# Patient Record
Sex: Male | Born: 1955 | Race: White | Hispanic: No | Smoking: Former smoker
Health system: Southern US, Community
[De-identification: ages and names within clinical notes are randomized; demographics above are authoritative.]

## PROBLEM LIST (undated history)

## (undated) DIAGNOSIS — I5189 Other ill-defined heart diseases: Secondary | ICD-10-CM

## (undated) DIAGNOSIS — C61 Malignant neoplasm of prostate: Secondary | ICD-10-CM

## (undated) DIAGNOSIS — M25569 Pain in unspecified knee: Secondary | ICD-10-CM

## (undated) DIAGNOSIS — M109 Gout, unspecified: Secondary | ICD-10-CM

## (undated) DIAGNOSIS — J45909 Unspecified asthma, uncomplicated: Secondary | ICD-10-CM

## (undated) DIAGNOSIS — M549 Dorsalgia, unspecified: Secondary | ICD-10-CM

## (undated) DIAGNOSIS — J449 Chronic obstructive pulmonary disease, unspecified: Secondary | ICD-10-CM

## (undated) DIAGNOSIS — I10 Essential (primary) hypertension: Secondary | ICD-10-CM

## (undated) HISTORY — DX: Malignant neoplasm of prostate: C61

## (undated) HISTORY — PX: PROSTATE SURGERY: SHX751

## (undated) HISTORY — DX: Dorsalgia, unspecified: M54.9

## (undated) HISTORY — DX: Pain in unspecified knee: M25.569

## (undated) HISTORY — DX: Essential (primary) hypertension: I10

---

## 2001-06-09 ENCOUNTER — Encounter: Payer: Self-pay | Admitting: Neurology

## 2001-06-09 ENCOUNTER — Ambulatory Visit (HOSPITAL_COMMUNITY): Admission: RE | Admit: 2001-06-09 | Discharge: 2001-06-09 | Payer: Self-pay | Admitting: Neurology

## 2008-08-22 ENCOUNTER — Ambulatory Visit: Payer: Self-pay | Admitting: Family Medicine

## 2010-01-22 ENCOUNTER — Ambulatory Visit: Payer: Self-pay | Admitting: Family Medicine

## 2010-02-02 ENCOUNTER — Ambulatory Visit: Payer: Self-pay | Admitting: Family Medicine

## 2010-03-16 ENCOUNTER — Ambulatory Visit: Payer: Self-pay | Admitting: Urology

## 2010-04-18 ENCOUNTER — Encounter (HOSPITAL_COMMUNITY): Payer: PRIVATE HEALTH INSURANCE

## 2010-04-18 ENCOUNTER — Other Ambulatory Visit: Payer: Self-pay | Admitting: Urology

## 2010-04-18 ENCOUNTER — Ambulatory Visit (HOSPITAL_COMMUNITY)
Admission: RE | Admit: 2010-04-18 | Discharge: 2010-04-18 | Disposition: A | Discharge status: Home / Self Care | Payer: PRIVATE HEALTH INSURANCE | Source: Ambulatory Visit | Attending: Urology | Admitting: Urology

## 2010-04-18 ENCOUNTER — Other Ambulatory Visit (HOSPITAL_COMMUNITY): Payer: Self-pay | Admitting: Urology

## 2010-04-18 DIAGNOSIS — C61 Malignant neoplasm of prostate: Secondary | ICD-10-CM | POA: Insufficient documentation

## 2010-04-18 DIAGNOSIS — I10 Essential (primary) hypertension: Secondary | ICD-10-CM | POA: Insufficient documentation

## 2010-04-18 DIAGNOSIS — Z01818 Encounter for other preprocedural examination: Secondary | ICD-10-CM | POA: Insufficient documentation

## 2010-04-18 DIAGNOSIS — M479 Spondylosis, unspecified: Secondary | ICD-10-CM | POA: Insufficient documentation

## 2010-04-18 LAB — BASIC METABOLIC PANEL
BUN: 11 mg/dL (ref 6–23)
CO2: 27 mEq/L (ref 19–32)
Calcium: 9.5 mg/dL (ref 8.4–10.5)
Chloride: 103 mEq/L (ref 96–112)
Creatinine, Ser: 0.99 mg/dL (ref 0.4–1.5)
GFR calc Af Amer: >60 mL/min (ref >60)
GFR calc non Af Amer: >60 mL/min (ref >60)
Glucose, Bld: 103 mg/dL — ABNORMAL HIGH (ref 70–99)
Potassium: 4.1 mEq/L (ref 3.5–5.1)
Sodium: 137 mEq/L (ref 135–145)

## 2010-04-18 LAB — CBC
HCT: 43.8 % (ref 39.0–52.0)
Hemoglobin: 15.2 g/dL (ref 13.0–17.0)
MCH: 32.9 pg (ref 26.0–34.0)
MCHC: 34.7 g/dL (ref 30.0–36.0)
MCV: 94.8 fL (ref 78.0–100.0)
Platelets: 218 10*3/uL (ref 150–400)
RBC: 4.62 MIL/uL (ref 4.22–5.81)
RDW: 12.1 % (ref 11.5–15.5)
WBC: 7.5 10*3/uL (ref 4.0–10.5)

## 2010-04-18 LAB — SURGICAL PCR SCREEN
MRSA, PCR: NEGATIVE
Staphylococcus aureus: NEGATIVE

## 2010-04-23 ENCOUNTER — Other Ambulatory Visit: Payer: Self-pay | Admitting: Urology

## 2010-04-23 ENCOUNTER — Inpatient Hospital Stay (HOSPITAL_COMMUNITY)
Admission: RE | Admit: 2010-04-23 | Discharge: 2010-04-24 | DRG: 708 | Disposition: A | Discharge status: Home / Self Care | Payer: PRIVATE HEALTH INSURANCE | Source: Ambulatory Visit | Attending: Urology | Admitting: Urology

## 2010-04-23 DIAGNOSIS — K219 Gastro-esophageal reflux disease without esophagitis: Secondary | ICD-10-CM | POA: Diagnosis present

## 2010-04-23 DIAGNOSIS — I10 Essential (primary) hypertension: Secondary | ICD-10-CM | POA: Diagnosis present

## 2010-04-23 DIAGNOSIS — C61 Malignant neoplasm of prostate: Principal | ICD-10-CM | POA: Diagnosis present

## 2010-04-23 DIAGNOSIS — G8929 Other chronic pain: Secondary | ICD-10-CM | POA: Diagnosis present

## 2010-04-23 DIAGNOSIS — M549 Dorsalgia, unspecified: Secondary | ICD-10-CM | POA: Diagnosis present

## 2010-04-23 LAB — HEMOGLOBIN AND HEMATOCRIT, BLOOD
HCT: 41.4 % (ref 39.0–52.0)
Hemoglobin: 14.5 g/dL (ref 13.0–17.0)

## 2010-04-23 LAB — TYPE AND SCREEN: ABO/RH(D): O POS

## 2010-04-23 LAB — ABO/RH: ABO/RH(D): O POS

## 2010-04-24 LAB — HEMOGLOBIN AND HEMATOCRIT, BLOOD: Hemoglobin: 12.9 g/dL — ABNORMAL LOW (ref 13.0–17.0)

## 2010-05-02 NOTE — Op Note (Signed)
NAME:  Jeffery Meyer, Jeffery Meyer                  ACCOUNT NO.:  [id number]  MEDICAL RECORD NO.:  [id number]           PATIENT TYPE:  I  LOCATION:  1423                         FACILITY:  [facility]  PHYSICIAN:  Heloise Purpura, MD      DATE OF BIRTH:  27-Feb-1955  DATE OF PROCEDURE:  04/23/2010 DATE OF DISCHARGE:                              OPERATIVE REPORT   PREOPERATIVE DIAGNOSIS:  Clinically localized adenocarcinoma of the prostate (clinical stage T1c N0 MX).  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  PROCEDURE:  Robotic-assisted laparoscopic radical prostatectomy (bilateral nerve sparing).  SURGEON:  Heloise Purpura, MD  ASSISTANT:  Delia Chimes NP-C  ANESTHESIA:  General.  COMPLICATIONS:  None.  ESTIMATED BLOOD LOSS:  300 cc.  INTRAVENOUS FLUIDS:  1500 cc of crystalloid.  SPECIMENS:  Prostate and seminal vesicles.  DISPOSITION:  Specimen to Pathology.  DRAINS: 1. A 20-French coude catheter. 2. A #19 Blake pelvic drain.  INDICATION:  Mr. Fridman is a 55 year old gentleman with clinically localized prostate cancer.  After discussion regarding management options for treatment, he elected to proceed with surgical therapy and the above procedures.  The potential risks, complications, and alternative treatment options were discussed in detail and informed consent was obtained.  DESCRIPTION OF PROCEDURE:  The patient was taken to the operating room and a general anesthetic was administered.  He was given preoperative antibiotics, placed in the dorsal lithotomy position, and prepped and draped in the usual sterile fashion.  Next, a preoperative time-out was performed. A site was then selected just to the left of the umbilicus for placement of the camera port.  This was placed using a standard open Hasson technique which allowed entry into the peritoneal cavity under direct vision without difficulty.  A 12-mm port was then  inserted and pneumoperitoneum established.  With the 0-degree lens, the abdomen was inspected and there was no evidence of any intra-abdominal injuries or other abnormalities.  A Foley catheter had been placed at beginning of the case as well.  The remaining ports were then placed with 8-mm robotic ports placed in the right lower quadrant, left lower quadrant, and far left lower quadrant.  A 5-mm port was placed in the right upper quadrant and a 12-mm port was placed in the far right lateral abdominal wall for laparoscopic assistance.  All ports were placed under direct vision without difficulty.  The surgical cart was then docked.  With the aid of cautery scissors, the bladder was reflected posteriorly allowing entry into space of Retzius and identification of the endopelvic fascia and prostate.  The endopelvic fascia was then incised from the apex back to the base of the prostate bilaterally and the underlying levator muscle fibers were swept laterally off the prostate thereby isolating the dorsal venous complex.  The dorsal vein was then stapled and divided with a 45 mm Flex Echelon stapler.  The bladder neck was identified with the aid of Foley catheter manipulation and was divided anteriorly.  This exposed the Foley catheter and the catheter balloon was deflated.  The catheter was then brought  into the operative field and used to retract the prostate anteriorly.  The posterior bladder neck was then examined and divided.  Dissection then proceeded between the bladder and prostate until the vasa deferentia and seminal vesicles were identified.  The vasa deferentia were isolated, divided, and lifted anteriorly.  The seminal vesicles were then dissected down to their tips with care to control the seminal vesicle arterial blood supply.  The seminal vesicles were then lifted anteriorly in the space between Denonvilliers fascia and the anterior rectum was bluntly developed thereby isolating  the vascular pedicles of the prostate.  The lateral prostatic fascia was then sharply incised allowing neurovascular bundles to be released.  The vascular pedicles of prostate were then ligated above the level of the neurovascular bundles with Hem-o-lok clips and divided with sharp cold- scissor dissection.  The neurovascular bundles were then swept off the apex of the prostate and urethra and the urethra was sharply transected allowing the prostate specimen to be disarticulated.  The pelvis was then copiously irrigated.  Hemostasis was evaluated and there was noted to be 2 separated arterial bleeding sites on the vascular pedicles bilaterally.  These were oversewn with figure-of-eight 3-0 Vicryl sutures, resulting in adequate hemostasis.  Attention then turned to the urethral anastomosis.  A 2-0 Vicryl slip-knot was placed between Denonvilliers fascia, the posterior bladder neck, and the posterior urethra to reapproximate these structures.  A double-armed 3-0 Monocryl suture was then used to perform a 360-degree running tension-free anastomosis between the bladder neck and urethra.  A new 20-French coude catheter was inserted into the bladder and irrigated.  There were no blood clots within the bladder and the anastomosis appeared to be watertight.  The pelvis was then examined 1 more time for hemostasis and there did appear to be some venous oozing from the distal aspects of the neurovascular bundles.  Therefore, 5 cc of FloSeal was placed over these areas which subsequently resulted in excellent hemostasis.  A #19 Blake drain was then brought through the left robotic port and positioned appropriately within the pelvis.  It was secured to skin with a nylon suture.  The pneumoperitoneum was let down and hemostasis again remained excellent.  The surgical cart was undocked.  The right lateral 12-mm port site was closed with a 0 Vicryl suture placed laparoscopically. All remaining ports  were removed under direct vision and the prostate specimen was removed intact within the Endopouch retrieval bag via the periumbilical port site.  This fascial opening was then closed with 2 running 0 Vicryl sutures.  All port sites were then injected with 0.25% Marcaine and reapproximated at the skin level with staples.  Sterile dressings were applied.  The patient appeared to tolerate procedure well without complications.  He was able to be extubated and transferred to the recovery unit in satisfactory condition.     Heloise Purpura, MD     LB/MEDQ  D:  04/23/2010  T:  04/23/2010  Job:  [id number]  Electronically Signed by Heloise Purpura MD on 05/02/2010 10:22:22 PM

## 2012-03-30 ENCOUNTER — Ambulatory Visit: Payer: Self-pay | Admitting: Family Medicine

## 2014-10-24 ENCOUNTER — Ambulatory Visit: Payer: Self-pay | Admitting: Family Medicine

## 2015-02-28 ENCOUNTER — Encounter: Payer: Self-pay | Admitting: Family Medicine

## 2015-03-24 ENCOUNTER — Encounter: Payer: Self-pay | Admitting: Family Medicine

## 2015-04-25 ENCOUNTER — Encounter: Payer: Self-pay | Admitting: Family Medicine

## 2015-05-26 ENCOUNTER — Encounter: Payer: Self-pay | Admitting: Family Medicine

## 2015-06-20 ENCOUNTER — Other Ambulatory Visit: Payer: Self-pay | Admitting: Family Medicine

## 2015-06-27 ENCOUNTER — Encounter: Payer: Self-pay | Admitting: Family Medicine

## 2015-06-27 ENCOUNTER — Ambulatory Visit (INDEPENDENT_AMBULATORY_CARE_PROVIDER_SITE_OTHER): Payer: BLUE CROSS/BLUE SHIELD | Admitting: Family Medicine

## 2015-06-27 VITALS — BP 132/80 | HR 86 | Temp 97.5°F | Resp 18 | Ht 74.0 in | Wt 248.2 lb

## 2015-06-27 DIAGNOSIS — I10 Essential (primary) hypertension: Secondary | ICD-10-CM

## 2015-06-27 DIAGNOSIS — M549 Dorsalgia, unspecified: Secondary | ICD-10-CM | POA: Insufficient documentation

## 2015-06-27 DIAGNOSIS — M25569 Pain in unspecified knee: Secondary | ICD-10-CM | POA: Insufficient documentation

## 2015-06-27 DIAGNOSIS — M25562 Pain in left knee: Secondary | ICD-10-CM

## 2015-06-27 DIAGNOSIS — M25561 Pain in right knee: Secondary | ICD-10-CM

## 2015-06-27 DIAGNOSIS — M545 Low back pain, unspecified: Secondary | ICD-10-CM

## 2015-06-27 MED ORDER — MELOXICAM 7.5 MG PO TABS: 7.5000 mg | ORAL_TABLET | Freq: Every day | ORAL | Status: DC

## 2015-06-27 MED ORDER — ATENOLOL 50 MG PO TABS
50.0000 mg | ORAL_TABLET | Freq: Every day | ORAL | Status: DC
Start: 2015-06-27 — End: 2015-10-23

## 2015-06-27 NOTE — Progress Notes (Signed)
Name: Jeffery Meyer   MRN: [id number]    DOB: [birth year]   Date:06/27/2015       Progress Note  Subjective  Chief Complaint  Chief Complaint  Patient presents with  . Hypertension    6 month follow up  . Joint Swelling    medication refills    Hypertension This is a chronic problem. The problem is controlled. Pertinent negatives include no blurred vision, chest pain, headaches or palpitations. Past treatments include beta blockers.   Knee and Low Back Pain: Pt. Is here for refill on Meloxicam 15  Mg daily, taken for bilateral knee and lower back pain. He has mild lumbar spondylosis, has back pain which intermittently radiates down into his right leg, no weakness or numbness in his legs. X rays of knee showed no abnormality but a small knee effusion.   Past Medical History  Diagnosis Date  . Hypertension   . Back pain   . Knee pain     Past Surgical History  Procedure Laterality Date  . Prostate surgery      Family History  Problem Relation Age of Onset  . Hypertension Mother   . Hypertension Father   . Hypertension Sister   . Hypertension Brother     Social History   Social History  . Marital Status: Married    Spouse Name: N/A  . Number of Children: N/A  . Years of Education: N/A   Occupational History  . Not on file.   Social History Main Topics  . Smoking status: Former Research scientist (life sciences)  . Smokeless tobacco: Never Used  . Alcohol Use: No  . Drug Use: No  . Sexual Activity: Not Currently   Other Topics Concern  . Not on file   Social History Narrative  . No narrative on file     Current outpatient prescriptions:  .  atenolol (TENORMIN) 50 MG tablet, TAKE ONE (1) TABLET BY MOUTH EVERY DAY, Disp: 30 tablet, Rfl: 0 .  meloxicam (MOBIC) 7.5 MG tablet, TAKE ONE (1) TABLET BY MOUTH TWO (2) TIMES DAILY, Disp: 60 tablet, Rfl: 0  No Known Allergies   Review of Systems  Eyes: Negative for blurred vision.  Cardiovascular: Negative for chest pain and  palpitations.  Musculoskeletal: Positive for back pain and joint pain.  Neurological: Negative for headaches.   Objective  Filed Vitals:   06/27/15 1424  BP: 132/80  Pulse: 86  Temp: 97.5 F (36.4 C)  TempSrc: Oral  Resp: 18  Height: 6\' 2"  (1.88 m)  Weight: 248 lb 3.2 oz (112.583 kg)  SpO2: 98%    Physical Exam  Constitutional: He is oriented to person, place, and time and well-developed, well-nourished, and in no distress.  Cardiovascular: Normal rate and regular rhythm.   Pulmonary/Chest: Effort normal and breath sounds normal.  Musculoskeletal:       Right knee: He exhibits no swelling. No tenderness found.       Left knee: He exhibits no swelling. No tenderness found.       Lumbar back: He exhibits tenderness and pain. He exhibits no spasm.       Back:  Neurological: He is alert and oriented to person, place, and time.  Nursing note and vitals reviewed.    Assessment & Plan  1. Essential hypertension Blood pressure stable, refills for atenolol provided - atenolol (TENORMIN) 50 MG tablet; Take 1 tablet (50 mg total) by mouth daily.  Dispense: 90 tablet; Refill: 0  2. Midline low back pain without  sciatica  - meloxicam (MOBIC) 7.5 MG tablet; Take 1 tablet (7.5 mg total) by mouth daily.  Dispense: 30 tablet; Refill: 0  3. Arthralgia of both knees    Sondra Blixt [facility] 06/27/2015 2:31 PM

## 2015-10-23 ENCOUNTER — Encounter: Payer: Self-pay | Admitting: Family Medicine

## 2015-10-23 ENCOUNTER — Ambulatory Visit (INDEPENDENT_AMBULATORY_CARE_PROVIDER_SITE_OTHER): Payer: BLUE CROSS/BLUE SHIELD | Admitting: Family Medicine

## 2015-10-23 VITALS — BP 127/71 | HR 109 | Temp 98.0°F | Resp 17 | Ht 74.0 in | Wt 244.3 lb

## 2015-10-23 DIAGNOSIS — R21 Rash and other nonspecific skin eruption: Secondary | ICD-10-CM | POA: Diagnosis not present

## 2015-10-23 DIAGNOSIS — I10 Essential (primary) hypertension: Secondary | ICD-10-CM | POA: Diagnosis not present

## 2015-10-23 LAB — CBC WITH DIFFERENTIAL/PLATELET
BASOS PCT: 0 %
Basophils Absolute: 0 cells/uL (ref 0–200)
EOS ABS: 53 {cells}/uL (ref 15–500)
EOS PCT: 1 %
HCT: 43 % (ref 38.5–50.0)
Hemoglobin: 14.9 g/dL (ref 13.2–17.1)
Lymphocytes Relative: 28 %
Lymphs Abs: 1484 cells/uL (ref 850–3900)
MCH: 33.5 pg — ABNORMAL HIGH (ref 27.0–33.0)
MCHC: 34.7 g/dL (ref 32.0–36.0)
MCV: 96.6 fL (ref 80.0–100.0)
MONOS PCT: 8 %
MPV: 10.5 fL (ref 7.5–12.5)
Monocytes Absolute: 424 cells/uL (ref 200–950)
NEUTROS ABS: 3339 {cells}/uL (ref 1500–7800)
Neutrophils Relative %: 63 %
PLATELETS: 220 10*3/uL (ref 140–400)
RBC: 4.45 MIL/uL (ref 4.20–5.80)
RDW: 13 % (ref 11.0–15.0)
WBC: 5.3 10*3/uL (ref 3.8–10.8)

## 2015-10-23 MED ORDER — ATENOLOL 50 MG PO TABS
50.0000 mg | ORAL_TABLET | Freq: Every day | ORAL | 0 refills | Status: DC
Start: 2015-10-23 — End: 2016-01-22

## 2015-10-23 NOTE — Progress Notes (Signed)
Name: Jeffery Meyer   MRN: [id number]    DOB: 12/05/1955   Date:10/23/2015       Progress Note  Subjective  Chief Complaint  Chief Complaint  Patient presents with  . Medication Refill    Hypertension  This is a chronic problem. The problem is controlled. Associated symptoms include malaise/fatigue. Pertinent negatives include no blurred vision, chest pain, headaches, palpitations or shortness of breath. Past treatments include beta blockers. There is no history of kidney disease, CAD/MI or CVA.  Rash  This is a new problem. The current episode started in the past 7 days. The problem has been gradually worsening since onset. The affected locations include the abdomen, left arm, right arm and back. The rash is characterized by redness. Associated with: 3 months ago, saw a tick in on his clothes, not sure if he had a tick bite. Associated symptoms include fatigue and vomiting. Pertinent negatives include no diarrhea or shortness of breath. (Last week, he experienced chills and vomiting, has been feeling fatigued ever since.) Past treatments include nothing.     Past Medical History:  Diagnosis Date  . Back pain   . Hypertension   . Knee pain     Past Surgical History:  Procedure Laterality Date  . PROSTATE SURGERY      Family History  Problem Relation Age of Onset  . Hypertension Mother   . Hypertension Father   . Hypertension Sister   . Hypertension Brother     Social History   Social History  . Marital status: Married    Spouse name: N/A  . Number of children: N/A  . Years of education: N/A   Occupational History  . Not on file.   Social History Main Topics  . Smoking status: Former Research scientist (life sciences)  . Smokeless tobacco: Never Used  . Alcohol use No  . Drug use: No  . Sexual activity: Not Currently   Other Topics Concern  . Not on file   Social History Narrative  . No narrative on file     Current Outpatient Prescriptions:  .  atenolol (TENORMIN) 50 MG tablet,  Take 1 tablet (50 mg total) by mouth daily., Disp: 90 tablet, Rfl: 0 .  meloxicam (MOBIC) 7.5 MG tablet, Take 1 tablet (7.5 mg total) by mouth daily., Disp: 30 tablet, Rfl: 0  No Known Allergies   Review of Systems  Constitutional: Positive for chills, fatigue and malaise/fatigue.  Eyes: Negative for blurred vision.  Respiratory: Negative for shortness of breath.   Cardiovascular: Negative for chest pain and palpitations.  Gastrointestinal: Positive for vomiting. Negative for abdominal pain and diarrhea.  Genitourinary: Negative for dysuria.  Skin: Positive for rash. Negative for itching.  Neurological: Negative for headaches.    Objective  Vitals:   10/23/15 0946  BP: 127/71  Pulse: (!) 109  Resp: 17  Temp: 98 F (36.7 C)  TempSrc: Oral  SpO2: 96%  Weight: 244 lb 4.8 oz (110.8 kg)  Height: 6\' 2"  (1.88 m)    Physical Exam  Constitutional: He is well-developed, well-nourished, and in no distress.  HENT:  Head: Normocephalic and atraumatic.  Cardiovascular: Regular rhythm, S1 normal and S2 normal.  Tachycardia present.   No murmur heard. Pulmonary/Chest: Effort normal and breath sounds normal. No respiratory distress. He has no rhonchi.  Abdominal: Soft. Bowel sounds are normal. There is no tenderness.  Musculoskeletal:       Right ankle: He exhibits no swelling.       Left ankle:  He exhibits no swelling.  Skin: Skin is warm and dry. Rash noted. Rash is maculopapular.  Scattered punctate maculo-papular, non erythematous, non-pruritic lesions over the torso, medial arms, and lower back.  Nursing note and vitals reviewed.    Assessment & Plan  1. Essential hypertension BP stable and controlled on present antihypertensive therapy - atenolol (TENORMIN) 50 MG tablet; Take 1 tablet (50 mg total) by mouth daily.  Dispense: 90 tablet; Refill: 0  2. Generalized maculopapular rash Unclear etiology, obtain serologies for Lyme's disease (unlikely). Differential diagnosis  includes viral exanthem - CBC with Differential - COMPLETE METABOLIC PANEL WITH GFR - Lyme Disease, IgM, Early Test w/ Rflx   Kadi Hession [facility] 10/23/2015 10:09 AM

## 2015-10-24 LAB — COMPLETE METABOLIC PANEL WITH GFR
ALBUMIN: 4.4 g/dL (ref 3.6–5.1)
ALK PHOS: 58 U/L (ref 40–115)
ALT: 24 U/L (ref 9–46)
AST: 24 U/L (ref 10–35)
BUN: 8 mg/dL (ref 7–25)
CALCIUM: 9.5 mg/dL (ref 8.6–10.3)
CHLORIDE: 105 mmol/L (ref 98–110)
CO2: 25 mmol/L (ref 20–31)
CREATININE: 0.74 mg/dL (ref 0.70–1.25)
GFR, Est Non African American: >89 mL/min (ref >=60)
Glucose, Bld: 109 mg/dL — ABNORMAL HIGH (ref 65–99)
POTASSIUM: 4.9 mmol/L (ref 3.5–5.3)
SODIUM: 140 mmol/L (ref 135–146)
Total Bilirubin: 0.6 mg/dL (ref 0.2–1.2)
Total Protein: 7.5 g/dL (ref 6.1–8.1)

## 2016-01-18 ENCOUNTER — Telehealth: Payer: Self-pay | Admitting: Family Medicine

## 2016-01-18 DIAGNOSIS — I10 Essential (primary) hypertension: Secondary | ICD-10-CM

## 2016-01-18 NOTE — Telephone Encounter (Signed)
Patient has appointment for 02/06/15, your first availability he is requesting a refill on atenolol asking that you send to [facility]. Patient have enough to last until tomorrow

## 2016-01-20 ENCOUNTER — Other Ambulatory Visit: Payer: Self-pay | Admitting: Family Medicine

## 2016-01-20 DIAGNOSIS — I10 Essential (primary) hypertension: Secondary | ICD-10-CM

## 2016-01-22 MED ORDER — ATENOLOL 50 MG PO TABS: 50.0000 mg | ORAL_TABLET | Freq: Every day | ORAL | 0 refills | Status: DC

## 2016-01-22 NOTE — Telephone Encounter (Signed)
Medication has been refilled and sent to [facility] 

## 2016-02-01 ENCOUNTER — Encounter: Payer: Self-pay | Admitting: Family Medicine

## 2016-02-01 ENCOUNTER — Ambulatory Visit (INDEPENDENT_AMBULATORY_CARE_PROVIDER_SITE_OTHER): Payer: BLUE CROSS/BLUE SHIELD | Admitting: Family Medicine

## 2016-02-01 VITALS — BP 130/65 | HR 77 | Temp 97.8°F | Resp 15 | Ht 74.0 in | Wt 260.2 lb

## 2016-02-01 DIAGNOSIS — I10 Essential (primary) hypertension: Secondary | ICD-10-CM

## 2016-02-01 DIAGNOSIS — M545 Low back pain: Secondary | ICD-10-CM | POA: Diagnosis not present

## 2016-02-01 DIAGNOSIS — E785 Hyperlipidemia, unspecified: Secondary | ICD-10-CM

## 2016-02-01 DIAGNOSIS — Z23 Encounter for immunization: Secondary | ICD-10-CM

## 2016-02-01 DIAGNOSIS — M1A9XX0 Chronic gout, unspecified, without tophus (tophi): Secondary | ICD-10-CM

## 2016-02-01 DIAGNOSIS — G8929 Other chronic pain: Secondary | ICD-10-CM

## 2016-02-01 DIAGNOSIS — M109 Gout, unspecified: Secondary | ICD-10-CM | POA: Insufficient documentation

## 2016-02-01 LAB — LIPID PANEL
CHOL/HDL RATIO: 3.6 ratio (ref <5.0)
CHOLESTEROL: 198 mg/dL (ref <200)
HDL: 55 mg/dL (ref >40)
LDL Cholesterol: 122 mg/dL — ABNORMAL HIGH (ref <100)
Triglycerides: 103 mg/dL (ref <150)
VLDL: 21 mg/dL (ref <30)

## 2016-02-01 LAB — URIC ACID: Uric Acid, Serum: 7.7 mg/dL (ref 4.0–8.0)

## 2016-02-01 MED ORDER — ATENOLOL 50 MG PO TABS: 50.0000 mg | ORAL_TABLET | Freq: Every day | ORAL | 0 refills | Status: DC

## 2016-02-01 MED ORDER — MELOXICAM 7.5 MG PO TABS: 7.5000 mg | ORAL_TABLET | Freq: Every day | ORAL | 0 refills | Status: DC

## 2016-02-01 NOTE — Progress Notes (Signed)
Name: Jeffery Meyer   MRN: [id number]    DOB: Jun 04, 1955   Date:02/01/2016       Progress Note  Subjective  Chief Complaint  Chief Complaint  Patient presents with  . Medication Refill    atenolol / meloxicam     Hypertension  This is a chronic problem. The problem is unchanged. The problem is controlled. Pertinent negatives include no blurred vision, chest pain, headaches or palpitations. Past treatments include beta blockers. There is no history of kidney disease, CAD/MI or CVA.  Back Pain  This is a chronic problem. The problem occurs intermittently. The problem is unchanged. The pain is present in the lumbar spine. The quality of the pain is described as aching and burning. The pain is at a severity of 5/10. The symptoms are aggravated by standing and position (staying in one position makes it worse). Pertinent negatives include no bladder incontinence, chest pain, headaches, leg pain, numbness or paresthesias. He has tried analgesics for the symptoms.     Past Medical History:  Diagnosis Date  . Back pain   . Hypertension   . Knee pain     Past Surgical History:  Procedure Laterality Date  . PROSTATE SURGERY      Family History  Problem Relation Age of Onset  . Hypertension Mother   . Hypertension Father   . Hypertension Sister   . Hypertension Brother     Social History   Social History  . Marital status: Married    Spouse name: N/A  . Number of children: N/A  . Years of education: N/A   Occupational History  . Not on file.   Social History Main Topics  . Smoking status: Former Research scientist (life sciences)  . Smokeless tobacco: Never Used  . Alcohol use No  . Drug use: No  . Sexual activity: Not Currently   Other Topics Concern  . Not on file   Social History Narrative  . No narrative on file     Current Outpatient Prescriptions:  .  atenolol (TENORMIN) 50 MG tablet, Take 1 tablet (50 mg total) by mouth daily., Disp: 90 tablet, Rfl: 0 .  meloxicam (MOBIC) 7.5 MG  tablet, Take 1 tablet (7.5 mg total) by mouth daily., Disp: 30 tablet, Rfl: 0  No Known Allergies   Review of Systems  Eyes: Negative for blurred vision.  Cardiovascular: Negative for chest pain and palpitations.  Genitourinary: Negative for bladder incontinence.  Musculoskeletal: Positive for back pain.  Neurological: Negative for numbness, headaches and paresthesias.      Objective  Vitals:   02/01/16 1101  BP: 130/65  Pulse: 77  Resp: 15  Temp: 97.8 F (36.6 C)  TempSrc: Oral  SpO2: 95%  Weight: 260 lb 3.2 oz (118 kg)  Height: 6\' 2"  (1.88 m)    Physical Exam  Constitutional: He is oriented to person, place, and time and well-developed, well-nourished, and in no distress.  Cardiovascular: Normal rate and regular rhythm.   Pulmonary/Chest: Effort normal and breath sounds normal.  Musculoskeletal:       Right knee: He exhibits no swelling. No tenderness found.       Left knee: He exhibits no swelling. No tenderness found.       Lumbar back: He exhibits tenderness and pain. He exhibits no spasm.       Back:       Right foot: Normal. There is no tenderness, no swelling, no crepitus and no deformity.  Neurological: He is alert and oriented  to person, place, and time.  Nursing note and vitals reviewed.       Assessment & Plan  1. Need for immunization against influenza  - Flu Vaccine QUAD 36+ mos PF IM (Fluarix & Fluzone Quad PF)  2. Essential hypertension  - atenolol (TENORMIN) 50 MG tablet; Take 1 tablet (50 mg total) by mouth daily.  Dispense: 90 tablet; Refill: 0  3. Chronic midline low back pain without sciatica  - meloxicam (MOBIC) 7.5 MG tablet; Take 1 tablet (7.5 mg total) by mouth daily.  Dispense: 90 tablet; Refill: 0  4. Hyperlipidemia, unspecified hyperlipidemia type  - Lipid Profile  5. Chronic gout involving toe of right foot without tophus, unspecified cause  - Uric acid    Jeffery Meyer [facility] 02/01/2016 11:10 AM

## 2016-02-06 ENCOUNTER — Ambulatory Visit: Payer: BLUE CROSS/BLUE SHIELD | Admitting: Family Medicine

## 2016-04-24 ENCOUNTER — Encounter: Payer: Self-pay | Admitting: Family Medicine

## 2016-04-24 ENCOUNTER — Ambulatory Visit (INDEPENDENT_AMBULATORY_CARE_PROVIDER_SITE_OTHER): Payer: BLUE CROSS/BLUE SHIELD | Admitting: Family Medicine

## 2016-04-24 VITALS — BP 136/84 | HR 86 | Temp 97.9°F | Resp 18 | Ht 74.0 in | Wt 266.7 lb

## 2016-04-24 DIAGNOSIS — I10 Essential (primary) hypertension: Secondary | ICD-10-CM | POA: Diagnosis not present

## 2016-04-24 DIAGNOSIS — E7800 Pure hypercholesterolemia, unspecified: Secondary | ICD-10-CM | POA: Diagnosis not present

## 2016-04-24 DIAGNOSIS — J0100 Acute maxillary sinusitis, unspecified: Secondary | ICD-10-CM | POA: Diagnosis not present

## 2016-04-24 DIAGNOSIS — F419 Anxiety disorder, unspecified: Secondary | ICD-10-CM | POA: Diagnosis not present

## 2016-04-24 MED ORDER — PAROXETINE HCL 20 MG PO TABS: 20.0000 mg | ORAL_TABLET | Freq: Every day | ORAL | 0 refills | Status: DC

## 2016-04-24 MED ORDER — ATENOLOL 50 MG PO TABS: 50.0000 mg | ORAL_TABLET | Freq: Every day | ORAL | 0 refills | Status: DC

## 2016-04-24 MED ORDER — MOMETASONE FUROATE 50 MCG/ACT NA SUSP: 2.0000 | Freq: Every day | NASAL | 1 refills | Status: AC

## 2016-04-24 MED ORDER — AMOXICILLIN-POT CLAVULANATE 875-125 MG PO TABS: 1.0000 | ORAL_TABLET | Freq: Two times a day (BID) | ORAL | 0 refills | Status: DC

## 2016-04-24 NOTE — Progress Notes (Signed)
Name: Jeffery Meyer   MRN: [id number]    DOB: 1955-08-04   Date:04/24/2016       Progress Note  Subjective  Chief Complaint  Chief Complaint  Patient presents with  . Hypertension    medication refills  . Hyperlipidemia    discuss labs  . Eye Problem    see black spots in the morning when getting up    Hypertension  This is a chronic problem. The problem is unchanged. The problem is controlled. Associated symptoms include anxiety and headaches (having sinus headaches). Pertinent negatives include no blurred vision, chest pain, malaise/fatigue, palpitations or shortness of breath. Past treatments include beta blockers. There is no history of kidney disease, CAD/MI or CVA.  Anxiety  Presents for follow-up visit. Symptoms include depressed mood, excessive worry, insomnia and nervous/anxious behavior. Patient reports no chest pain, palpitations, panic or shortness of breath. The severity of symptoms is moderate.    Sinus Problem  This is a new problem. The current episode started 1 to 4 weeks ago (3-4 weeks ago). The problem is unchanged. There has been no fever. Associated symptoms include congestion, headaches (having sinus headaches), sinus pressure, sneezing and a sore throat. Pertinent negatives include no chills or shortness of breath. Treatments tried: OTC nasal sprays.     Past Medical History:  Diagnosis Date  . Back pain   . Hypertension   . Knee pain     Past Surgical History:  Procedure Laterality Date  . PROSTATE SURGERY      Family History  Problem Relation Age of Onset  . Hypertension Mother   . Hypertension Father   . Hypertension Sister   . Hypertension Brother     Social History   Social History  . Marital status: Married    Spouse name: N/A  . Number of children: N/A  . Years of education: N/A   Occupational History  . Not on file.   Social History Main Topics  . Smoking status: Former Research scientist (life sciences)  . Smokeless tobacco: Never Used  . Alcohol use No   . Drug use: No  . Sexual activity: Not Currently   Other Topics Concern  . Not on file   Social History Narrative  . No narrative on file     Current Outpatient Prescriptions:  .  atenolol (TENORMIN) 50 MG tablet, Take 1 tablet (50 mg total) by mouth daily., Disp: 90 tablet, Rfl: 0 .  meloxicam (MOBIC) 7.5 MG tablet, Take 1 tablet (7.5 mg total) by mouth daily., Disp: 90 tablet, Rfl: 0 .  PARoxetine (PAXIL) 20 MG tablet, Take 20 mg by mouth daily., Disp: , Rfl:   No Known Allergies   Review of Systems  Constitutional: Negative for chills, fever and malaise/fatigue.  HENT: Positive for congestion, sinus pain, sinus pressure, sneezing and sore throat.   Eyes: Negative for blurred vision.  Respiratory: Negative for shortness of breath.   Cardiovascular: Negative for chest pain and palpitations.  Neurological: Positive for headaches (having sinus headaches).  Psychiatric/Behavioral: The patient is nervous/anxious and has insomnia.      Objective  Vitals:   04/24/16 1512  BP: 136/84  Pulse: 86  Resp: 18  Temp: 97.9 F (36.6 C)  TempSrc: Oral  SpO2: 94%  Weight: 266 lb 11.2 oz (121 kg)  Height: 6\' 2"  (1.88 m)    Physical Exam  Constitutional: He is oriented to person, place, and time and well-developed, well-nourished, and in no distress.  HENT:  Head: Normocephalic and atraumatic.  Right Ear: Tympanic membrane and ear canal normal. No drainage or swelling.  Left Ear: Tympanic membrane and ear canal normal. No drainage or swelling.  Nose: Right sinus exhibits maxillary sinus tenderness. Left sinus exhibits maxillary sinus tenderness.  Mouth/Throat: Posterior oropharyngeal erythema present.  Nasal turbinate hypoertrophy  Cardiovascular: Normal rate, regular rhythm, S1 normal and S2 normal.   No murmur heard. Pulmonary/Chest: Effort normal. No respiratory distress. He has no decreased breath sounds. He has no wheezes. He has no rhonchi.  Abdominal: Soft. Bowel  sounds are normal. There is no tenderness.  Neurological: He is alert and oriented to person, place, and time.  Skin: Skin is warm and dry.  Psychiatric: Mood, memory, affect and judgment normal.  Nursing note and vitals reviewed.    Recent Results (from the past 2160 hour(s))  Lipid Profile     Status: Abnormal   Collection Time: 02/01/16 11:16 AM  Result Value Ref Range   Cholesterol 198 <200 mg/dL   Triglycerides 103 <150 mg/dL   HDL 55 >40 mg/dL   Total CHOL/HDL Ratio 3.6 <5.0 Ratio   VLDL 21 <30 mg/dL   LDL Cholesterol 122 (H) <100 mg/dL  Uric acid     Status: None   Collection Time: 02/01/16 11:29 AM  Result Value Ref Range   Uric Acid, Serum 7.7 4.0 - 8.0 mg/dL     Assessment & Plan  1. Essential hypertension BP stable on present hypertensive therapy - atenolol (TENORMIN) 50 MG tablet; Take 1 tablet (50 mg total) by mouth daily.  Dispense: 90 tablet; Refill: 0  2. Acute non-recurrent maxillary sinusitis By history and exam, unresponsive to OTC nasal sprays, started on antibiotics and Nasonex - amoxicillin-clavulanate (AUGMENTIN) 875-125 MG tablet; Take 1 tablet by mouth 2 (two) times daily.  Dispense: 20 tablet; Refill: 0 - mometasone (NASONEX) 50 MCG/ACT nasal spray; Place 2 sprays into the nose daily.  Dispense: 17 g; Refill: 1  3. Anxiety  - PARoxetine (PAXIL) 20 MG tablet; Take 1 tablet (20 mg total) by mouth daily.  Dispense: 90 tablet; Refill: 0  4. Pure hypercholesterolemia  - Lipid panel   Syed [facility] 04/24/2016 3:37 PM

## 2016-04-25 LAB — LIPID PANEL
CHOL/HDL RATIO: 3.5 ratio (ref <5.0)
Cholesterol: 179 mg/dL (ref <200)
HDL: 51 mg/dL (ref >40)
LDL CALC: 113 mg/dL — AB (ref <100)
Triglycerides: 77 mg/dL (ref <150)
VLDL: 15 mg/dL (ref <30)

## 2016-05-21 ENCOUNTER — Ambulatory Visit (INDEPENDENT_AMBULATORY_CARE_PROVIDER_SITE_OTHER): Payer: BLUE CROSS/BLUE SHIELD | Admitting: Family Medicine

## 2016-05-21 ENCOUNTER — Encounter: Payer: Self-pay | Admitting: Family Medicine

## 2016-05-21 VITALS — BP 132/81 | HR 84 | Temp 98.0°F | Resp 17 | Ht 74.0 in | Wt 258.5 lb

## 2016-05-21 DIAGNOSIS — Z0000 Encounter for general adult medical examination without abnormal findings: Secondary | ICD-10-CM

## 2016-05-21 DIAGNOSIS — R739 Hyperglycemia, unspecified: Secondary | ICD-10-CM | POA: Insufficient documentation

## 2016-05-21 NOTE — Progress Notes (Signed)
Name: Jeffery Meyer   MRN: [id number]    DOB: 1955/03/10   Date:05/21/2016       Progress Note  Subjective  Chief Complaint  Chief Complaint  Patient presents with  . Annual Exam    CPE    HPI  Pt. Presents for Complete Physical Exam. Last colonoscopy was reportedly 4 years ago, he was asked to come back in 6 years at that time, records not available.  He had prostate cancer and underwent prostatectomy, follows up with Urology.   Past Medical History:  Diagnosis Date  . Back pain   . Hypertension   . Knee pain   . Prostate cancer [facility])     Past Surgical History:  Procedure Laterality Date  . PROSTATE SURGERY      Family History  Problem Relation Age of Onset  . Hypertension Mother   . Hypertension Father   . Hypertension Sister   . Hypertension Brother     Social History   Social History  . Marital status: Married    Spouse name: N/A  . Number of children: N/A  . Years of education: N/A   Occupational History  . Not on file.   Social History Main Topics  . Smoking status: Former Research scientist (life sciences)  . Smokeless tobacco: Never Used  . Alcohol use 12.6 oz/week    21 Cans of beer per week     Comment: 2-3 beers a day  . Drug use: No  . Sexual activity: Not Currently   Other Topics Concern  . Not on file   Social History Narrative  . No narrative on file     Current Outpatient Prescriptions:  .  atenolol (TENORMIN) 50 MG tablet, Take 1 tablet (50 mg total) by mouth daily., Disp: 90 tablet, Rfl: 0 .  meloxicam (MOBIC) 7.5 MG tablet, Take 1 tablet (7.5 mg total) by mouth daily., Disp: 90 tablet, Rfl: 0 .  mometasone (NASONEX) 50 MCG/ACT nasal spray, Place 2 sprays into the nose daily., Disp: 17 g, Rfl: 1 .  PARoxetine (PAXIL) 20 MG tablet, Take 1 tablet (20 mg total) by mouth daily., Disp: 90 tablet, Rfl: 0 .  amoxicillin-clavulanate (AUGMENTIN) 875-125 MG tablet, Take 1 tablet by mouth 2 (two) times daily. (Patient not taking: Reported on 05/21/2016), Disp: 20  tablet, Rfl: 0  No Known Allergies   Review of Systems  Constitutional: Negative for chills, fever, malaise/fatigue and weight loss (trying to lose weight).  HENT: Negative for congestion, ear pain and sore throat.   Eyes: Negative for blurred vision and double vision.  Respiratory: Negative for cough, sputum production and shortness of breath.   Cardiovascular: Negative for chest pain and leg swelling.  Gastrointestinal: Negative for abdominal pain, blood in stool, constipation, diarrhea, nausea and vomiting.  Genitourinary: Negative for hematuria.  Musculoskeletal: Negative for back pain and neck pain.  Neurological: Negative for dizziness and headaches.  Psychiatric/Behavioral: Negative for depression. The patient is not nervous/anxious and does not have insomnia.       Objective  Vitals:   05/21/16 1501  BP: 132/81  Pulse: 84  Resp: 17  Temp: 98 F (36.7 C)  TempSrc: Oral  SpO2: 96%  Weight: 258 lb 8 oz (117.3 kg)  Height: 6\' 2"  (1.88 m)    Physical Exam  Constitutional: He is oriented to person, place, and time and well-developed, well-nourished, and in no distress.  HENT:  Head: Normocephalic and atraumatic.  Eyes: Conjunctivae and EOM are normal. Pupils are equal, round,  and reactive to light.  Neck: Neck supple. No thyromegaly present.  Cardiovascular: Normal rate, regular rhythm and normal heart sounds.   No murmur heard. Pulmonary/Chest: Effort normal and breath sounds normal. He has no wheezes.  Abdominal: Soft. Bowel sounds are normal. There is no tenderness.  Musculoskeletal: Normal range of motion. He exhibits no edema.  Neurological: He is alert and oriented to person, place, and time.  Psychiatric: Mood, memory, affect and judgment normal.  Nursing note and vitals reviewed.         Assessment & Plan  1. Annual physical exam Obtain age-appropriate laboratory screenings, colonoscopy due in 2 years, patient to follow-up with urology regarding  PSA - TSH - VITAMIN D 25 Hydroxy (Vit-D Deficiency, Fractures)    Jonnie Truxillo Asad A. [facility] 05/21/2016 3:22 PM

## 2016-05-22 LAB — TSH: TSH: 2.02 mIU/L (ref 0.40–4.50)

## 2016-05-22 LAB — VITAMIN D 25 HYDROXY (VIT D DEFICIENCY, FRACTURES): Vit D, 25-Hydroxy: 31 ng/mL (ref 30–100)

## 2016-05-24 ENCOUNTER — Encounter: Payer: BLUE CROSS/BLUE SHIELD | Admitting: Family Medicine

## 2016-07-29 ENCOUNTER — Ambulatory Visit (INDEPENDENT_AMBULATORY_CARE_PROVIDER_SITE_OTHER): Payer: BLUE CROSS/BLUE SHIELD | Admitting: Family Medicine

## 2016-07-29 ENCOUNTER — Encounter: Payer: Self-pay | Admitting: Family Medicine

## 2016-07-29 VITALS — BP 134/70 | HR 96 | Temp 98.1°F | Resp 14 | Wt 253.7 lb

## 2016-07-29 DIAGNOSIS — IMO0001 Reserved for inherently not codable concepts without codable children: Secondary | ICD-10-CM

## 2016-07-29 DIAGNOSIS — I10 Essential (primary) hypertension: Secondary | ICD-10-CM | POA: Diagnosis not present

## 2016-07-29 DIAGNOSIS — S20469A Insect bite (nonvenomous) of unspecified back wall of thorax, initial encounter: Secondary | ICD-10-CM | POA: Insufficient documentation

## 2016-07-29 DIAGNOSIS — R739 Hyperglycemia, unspecified: Secondary | ICD-10-CM | POA: Diagnosis not present

## 2016-07-29 DIAGNOSIS — S20461A Insect bite (nonvenomous) of right back wall of thorax, initial encounter: Secondary | ICD-10-CM | POA: Diagnosis not present

## 2016-07-29 DIAGNOSIS — E7800 Pure hypercholesterolemia, unspecified: Secondary | ICD-10-CM | POA: Diagnosis not present

## 2016-07-29 LAB — POCT GLYCOSYLATED HEMOGLOBIN (HGB A1C): HEMOGLOBIN A1C: 4.9

## 2016-07-29 MED ORDER — ATORVASTATIN CALCIUM 10 MG PO TABS: 10.0000 mg | ORAL_TABLET | Freq: Every day | ORAL | 0 refills | Status: DC

## 2016-07-29 MED ORDER — ATENOLOL 50 MG PO TABS: 50.0000 mg | ORAL_TABLET | Freq: Every day | ORAL | 0 refills | Status: DC

## 2016-07-29 NOTE — Progress Notes (Signed)
Name: Jeffery Meyer   MRN: [id number]    DOB: Sep 22, 1955   Date:07/29/2016       Progress Note  Subjective  Chief Complaint  Chief Complaint  Patient presents with  . Medication Refill    Hypertension  This is a chronic problem. The problem is unchanged. The problem is controlled. Pertinent negatives include no blurred vision, chest pain, headaches or palpitations. Past treatments include beta blockers. There is no history of kidney disease, CAD/MI or CVA.  Hyperlipidemia  This is a chronic problem. The problem is uncontrolled. Recent lipid tests were reviewed and are high. Exacerbating diseases include obesity. Pertinent negatives include no chest pain. He is currently on no antihyperlipidemic treatment.   Patient has a raised area on the right middle back that he would like me to check. It feels like an insect bite to him, he has been feeling drained since yesterday. Of not: he also has history of [place] Spotted fever.    Past Medical History:  Diagnosis Date  . Back pain   . Hypertension   . Knee pain   . Prostate cancer [name])     Past Surgical History:  Procedure Laterality Date  . PROSTATE SURGERY      Family History  Problem Relation Age of Onset  . Hypertension Mother   . Hypertension Father   . Hypertension Sister   . Hypertension Brother     Social History   Social History  . Marital status: Married    Spouse name: N/A  . Number of children: N/A  . Years of education: N/A   Occupational History  . Not on file.   Social History Main Topics  . Smoking status: Former Research scientist (life sciences)  . Smokeless tobacco: Never Used  . Alcohol use 12.6 oz/week    21 Cans of beer per week     Comment: 2-3 beers a day  . Drug use: No  . Sexual activity: Not Currently   Other Topics Concern  . Not on file   Social History Narrative  . No narrative on file     Current Outpatient Prescriptions:  .  atenolol (TENORMIN) 50 MG tablet, Take 1 tablet (50 mg total) by mouth  daily., Disp: 90 tablet, Rfl: 0 .  meloxicam (MOBIC) 7.5 MG tablet, Take 1 tablet (7.5 mg total) by mouth daily., Disp: 90 tablet, Rfl: 0 .  mometasone (NASONEX) 50 MCG/ACT nasal spray, Place 2 sprays into the nose daily., Disp: 17 g, Rfl: 1 .  PARoxetine (PAXIL) 20 MG tablet, Take 1 tablet (20 mg total) by mouth daily., Disp: 90 tablet, Rfl: 0  No Known Allergies   Review of Systems  Eyes: Negative for blurred vision.  Cardiovascular: Negative for chest pain and palpitations.  Neurological: Negative for headaches.      Objective  Vitals:   07/29/16 1500  BP: 134/70  Pulse: 96  Resp: 14  Temp: 98.1 F (36.7 C)  TempSrc: Oral  SpO2: 95%  Weight: 253 lb 11.2 oz (115.1 kg)    Physical Exam  Constitutional: He is oriented to person, place, and time and well-developed, well-nourished, and in no distress.  HENT:  Head: Normocephalic and atraumatic.  Cardiovascular: Normal rate, regular rhythm and normal heart sounds.   No murmur heard. Pulmonary/Chest: Effort normal and breath sounds normal. He has no wheezes. He has no rales.  Musculoskeletal:       Back:  Raised erythematous maculo-papular area on the right middle back  Neurological: He is  alert and oriented to person, place, and time.  Psychiatric: Mood, memory, affect and judgment normal.  Nursing note and vitals reviewed.     Assessment & Plan  1. Essential hypertension BP stable on present antibiotic intensive therapy - atenolol (TENORMIN) 50 MG tablet; Take 1 tablet (50 mg total) by mouth daily.  Dispense: 90 tablet; Refill: 0  2. Pure hypercholesterolemia  - atorvastatin (LIPITOR) 10 MG tablet; Take 1 tablet (10 mg total) by mouth daily.  Dispense: 90 tablet; Refill: 0  3. Hyperglycemia Worry of care A1c is 4.9%, considered normal - POCT glycosylated hemoglobin (Hb A1C)  4. Insect bite of back, right, initial encounter Obtain testing for Lyme's disease and [facility] spotted fever based on the  possibility of an insect bite, consider antimicrobial prophylaxis if he has constitutional symptoms - Lyme Ab/Western Blot Reflex - [place] spotted fvr abs pnl(IgG+IgM)   [name] [name] 07/29/2016 3:05 PM

## 2016-07-30 LAB — ROCKY MTN SPOTTED FVR ABS PNL(IGG+IGM)
RMSF IgG: NOT DETECTED
RMSF IgM: NOT DETECTED

## 2016-07-30 LAB — LYME AB/WESTERN BLOT REFLEX: B burgdorferi Ab IgG+IgM: <0.9 Index (ref <0.90)

## 2016-10-22 ENCOUNTER — Encounter: Payer: Self-pay | Admitting: Family Medicine

## 2016-10-22 ENCOUNTER — Ambulatory Visit (INDEPENDENT_AMBULATORY_CARE_PROVIDER_SITE_OTHER): Payer: BLUE CROSS/BLUE SHIELD | Admitting: Family Medicine

## 2016-10-22 DIAGNOSIS — E7800 Pure hypercholesterolemia, unspecified: Secondary | ICD-10-CM | POA: Diagnosis not present

## 2016-10-22 DIAGNOSIS — I10 Essential (primary) hypertension: Secondary | ICD-10-CM

## 2016-10-22 MED ORDER — ATORVASTATIN CALCIUM 10 MG PO TABS: 10.0000 mg | ORAL_TABLET | Freq: Every day | ORAL | 0 refills | Status: AC

## 2016-10-22 MED ORDER — ATENOLOL 50 MG PO TABS: 50.0000 mg | ORAL_TABLET | Freq: Every day | ORAL | 0 refills | Status: AC

## 2016-10-22 NOTE — Progress Notes (Signed)
Name: Jeffery Meyer   MRN: [id number]    DOB: [birth year]   Date:10/22/2016       Progress Note  Subjective  Chief Complaint  Chief Complaint  Patient presents with  . Medication Refill    Hypertension  This is a chronic problem. The problem is unchanged. The problem is controlled. Pertinent negatives include no blurred vision, chest pain, headaches or palpitations. Past treatments include beta blockers. There is no history of kidney disease, CAD/MI or CVA.  Hyperlipidemia  This is a chronic problem. The problem is uncontrolled. Recent lipid tests were reviewed and are high. Exacerbating diseases include obesity. Pertinent negatives include no chest pain. He is currently on no antihyperlipidemic treatment.    Past Medical History:  Diagnosis Date  . Back pain   . Hypertension   . Knee pain   . Prostate cancer [facility])     Past Surgical History:  Procedure Laterality Date  . PROSTATE SURGERY      Family History  Problem Relation Age of Onset  . Hypertension Mother   . Hypertension Father   . Hypertension Sister   . Hypertension Brother     Social History   Social History  . Marital status: Married    Spouse name: N/A  . Number of children: N/A  . Years of education: N/A   Occupational History  . Not on file.   Social History Main Topics  . Smoking status: Former Research scientist (life sciences)  . Smokeless tobacco: Never Used  . Alcohol use 12.6 oz/week    21 Cans of beer per week     Comment: 2-3 beers a day  . Drug use: No  . Sexual activity: Not Currently   Other Topics Concern  . Not on file   Social History Narrative  . No narrative on file     Current Outpatient Prescriptions:  .  atenolol (TENORMIN) 50 MG tablet, Take 1 tablet (50 mg total) by mouth daily., Disp: 90 tablet, Rfl: 0 .  atorvastatin (LIPITOR) 10 MG tablet, Take 1 tablet (10 mg total) by mouth daily., Disp: 90 tablet, Rfl: 0 .  meloxicam (MOBIC) 7.5 MG tablet, Take 1 tablet (7.5 mg total) by mouth daily.,  Disp: 90 tablet, Rfl: 0 .  mometasone (NASONEX) 50 MCG/ACT nasal spray, Place 2 sprays into the nose daily., Disp: 17 g, Rfl: 1  No Known Allergies   Review of Systems  Eyes: Negative for blurred vision.  Cardiovascular: Negative for chest pain and palpitations.  Neurological: Negative for headaches.     Objective  Vitals:   10/22/16 1537  BP: 136/75  Pulse: 91  Resp: 17  Temp: 98.2 F (36.8 C)  TempSrc: Oral  SpO2: 96%  Weight: 259 lb 6.4 oz (117.7 kg)  Height: 6\' 3"  (1.905 m)    Physical Exam  Constitutional: He is oriented to person, place, and time and well-developed, well-nourished, and in no distress.  HENT:  Head: Normocephalic and atraumatic.  Cardiovascular: Normal rate, regular rhythm and normal heart sounds.   No murmur heard. Pulmonary/Chest: Effort normal and breath sounds normal. He has no wheezes. He has no rales.  Neurological: He is alert and oriented to person, place, and time.  Psychiatric: Mood, memory, affect and judgment normal.  Nursing note and vitals reviewed.   Assessment & Plan  1. Essential hypertension BP stable on present anti- hypertensive treatment - atenolol (TENORMIN) 50 MG tablet; Take 1 tablet (50 mg total) by mouth daily.  Dispense: 90 tablet; Refill: 0  2. Pure hypercholesterolemia FLP above goal in March 2018, now on statin, reassess and adjust as appropriate - atorvastatin (LIPITOR) 10 MG tablet; Take 1 tablet (10 mg total) by mouth daily.  Dispense: 90 tablet; Refill: 0 - Lipid panel - COMPLETE METABOLIC PANEL WITH GFR   Magdalynn Davilla Asad A. [facility] 10/22/2016 4:11 PM

## 2017-07-16 ENCOUNTER — Emergency Department: Payer: BLUE CROSS/BLUE SHIELD

## 2017-07-16 ENCOUNTER — Other Ambulatory Visit: Payer: Self-pay

## 2017-07-16 ENCOUNTER — Emergency Department
Admission: EM | Admit: 2017-07-16 | Discharge: 2017-07-16 | Disposition: A | Discharge status: Home / Self Care | Payer: BLUE CROSS/BLUE SHIELD | Attending: Emergency Medicine | Admitting: Emergency Medicine

## 2017-07-16 DIAGNOSIS — Z79899 Other long term (current) drug therapy: Secondary | ICD-10-CM | POA: Diagnosis not present

## 2017-07-16 DIAGNOSIS — R110 Nausea: Secondary | ICD-10-CM

## 2017-07-16 DIAGNOSIS — Z8546 Personal history of malignant neoplasm of prostate: Secondary | ICD-10-CM | POA: Diagnosis not present

## 2017-07-16 DIAGNOSIS — I10 Essential (primary) hypertension: Secondary | ICD-10-CM | POA: Insufficient documentation

## 2017-07-16 DIAGNOSIS — Z87891 Personal history of nicotine dependence: Secondary | ICD-10-CM | POA: Insufficient documentation

## 2017-07-16 DIAGNOSIS — R0602 Shortness of breath: Secondary | ICD-10-CM

## 2017-07-16 LAB — BASIC METABOLIC PANEL
Anion gap: 10 (ref 5–15)
BUN: 13 mg/dL (ref 6–20)
CALCIUM: 8.4 mg/dL — AB (ref 8.9–10.3)
CO2: 21 mmol/L — AB (ref 22–32)
CREATININE: 0.95 mg/dL (ref 0.61–1.24)
Chloride: 102 mmol/L (ref 101–111)
GFR calc Af Amer: >60 mL/min (ref >60)
GFR calc non Af Amer: >60 mL/min (ref >60)
GLUCOSE: 155 mg/dL — AB (ref 65–99)
Potassium: 3.7 mmol/L (ref 3.5–5.1)
Sodium: 133 mmol/L — ABNORMAL LOW (ref 135–145)

## 2017-07-16 LAB — CBC
HCT: 44.7 % (ref 40.0–52.0)
Hemoglobin: 16 g/dL (ref 13.0–18.0)
MCH: 35.4 pg — AB (ref 26.0–34.0)
MCHC: 35.7 g/dL (ref 32.0–36.0)
MCV: 99.1 fL (ref 80.0–100.0)
PLATELETS: 240 10*3/uL (ref 150–440)
RBC: 4.51 MIL/uL (ref 4.40–5.90)
RDW: 13.5 % (ref 11.5–14.5)
WBC: 10.2 10*3/uL (ref 3.8–10.6)

## 2017-07-16 LAB — TROPONIN I
Troponin I: <0.03 ng/mL (ref <0.03)
Troponin I: <0.03 ng/mL (ref <0.03)

## 2017-07-16 MED ORDER — MECLIZINE HCL 25 MG PO TABS
50.0000 mg | ORAL_TABLET | Freq: Once | ORAL | Status: AC
  Administered 2017-07-16: 50 mg via ORAL
  Filled 2017-07-16: qty 2

## 2017-07-16 MED ORDER — ONDANSETRON HCL 4 MG/2ML IJ SOLN
4.0000 mg | Freq: Once | INTRAMUSCULAR | Status: AC
  Administered 2017-07-16: 4 mg via INTRAVENOUS
  Filled 2017-07-16: qty 2

## 2017-07-16 MED ORDER — MECLIZINE HCL 25 MG PO TABS: 25.0000 mg | ORAL_TABLET | Freq: Three times a day (TID) | ORAL | 0 refills | Status: DC | PRN

## 2017-07-16 MED ORDER — SODIUM CHLORIDE 0.9 % IV BOLUS
1000.0000 mL | Freq: Once | INTRAVENOUS | Status: AC
  Administered 2017-07-16: 1000 mL via INTRAVENOUS

## 2017-07-16 NOTE — ED Notes (Signed)
Pt alert and oriented X4, active, cooperative, pt in NAD. RR even and unlabored, color WNL.  Pt informed to return if any life threatening symptoms occur.  Discharge and followup instructions reviewed.  

## 2017-07-16 NOTE — ED Triage Notes (Signed)
Awoke this AM with SOB. VSS with EMS. Hx of hypertension, hypertensive systolic 500'B with arrival of FD. 20G to L hand by EMS. EDP at bedside. CBG 167. Pt alert and oriented X4, active, cooperative, pt in NAD. RR even and unlabored, color WNL.

## 2017-07-16 NOTE — ED Notes (Signed)
Pt ambulated outside of room with steady gait noted. PT states he still has slight dizziness with ambulation. MD aware

## 2017-07-16 NOTE — ED Notes (Signed)
Patient transported to X-ray 

## 2017-07-16 NOTE — ED Notes (Signed)
ED Provider at bedside. 

## 2017-07-16 NOTE — ED Provider Notes (Signed)
[facility] Emergency Department Provider Note  Time seen: 8:39 AM  I have reviewed the triage vital signs and the nursing notes.   HISTORY  Chief Complaint Shortness of Breath    HPI Jeffery Meyer is a 62 y.o. male with a past medical history of hypertension, hyperlipidemia, anxiety, presents to the emergency department for shortness of breath.  According to the patient he woke up around 630 this morning feeling short of breath and somewhat nauseated.  Denies any chest pain or abdominal pain.  No fever or vomiting.  EMS states fire department first arrived and checked his blood pressure and it was in the 200s however EMS states blood pressures in the 160s in the ambulance.  Upon arrival patient states that shortness of breath is much improved he continues to feel somewhat nauseated but is not sure if that was due to the ambulance ride.   Past Medical History:  Diagnosis Date  . Back pain   . Hypertension   . Knee pain   . Prostate cancer [facility])     Patient Active Problem List   Diagnosis Date Noted  . Insect bite of back 07/29/2016  . Hyperglycemia 05/21/2016  . Acute non-recurrent maxillary sinusitis 04/24/2016  . Anxiety 04/24/2016  . Hyperlipidemia 02/01/2016  . Gout 02/01/2016  . Hypertension 06/27/2015  . Back pain 06/27/2015  . Knee pain 06/27/2015    Past Surgical History:  Procedure Laterality Date  . PROSTATE SURGERY      Prior to Admission medications   Medication Sig Start Date End Date Taking? Authorizing Provider  atenolol (TENORMIN) 50 MG tablet Take 1 tablet (50 mg total) by mouth daily. 10/22/16   Roselee Nova, MD  atorvastatin (LIPITOR) 10 MG tablet Take 1 tablet (10 mg total) by mouth daily. 10/22/16   Roselee Nova, MD  meloxicam (MOBIC) 7.5 MG tablet Take 1 tablet (7.5 mg total) by mouth daily. 02/01/16   Roselee Nova, MD  mometasone (NASONEX) 50 MCG/ACT nasal spray Place 2 sprays into the nose daily. 04/24/16    Roselee Nova, MD    No Known Allergies  Family History  Problem Relation Age of Onset  . Hypertension Mother   . Hypertension Father   . Hypertension Sister   . Hypertension Brother     Social History Social History   Tobacco Use  . Smoking status: Former Research scientist (life sciences)  . Smokeless tobacco: Never Used  Substance Use Topics  . Alcohol use: Yes    Alcohol/week: 12.6 oz    Types: 21 Cans of beer per week    Comment: 2-3 beers a day  . Drug use: No    Review of Systems Constitutional: Negative for fever. Eyes: Negative for visual complaints ENT: Negative for recent illness/congestion Cardiovascular: Negative for chest pain. Respiratory: Positive for shortness of breath, minimal at this time. Gastrointestinal: Negative for abdominal pain, vomiting.  Positive for nausea. Musculoskeletal: Negative for leg pain or swelling. Skin: Negative for skin complaints  Neurological: Negative for headache All other ROS negative  ____________________________________________   PHYSICAL EXAM:  Constitutional: Alert and oriented. Well appearing and in no distress. Eyes: Normal exam ENT   Head: Normocephalic and atraumatic.   Mouth/Throat: Mucous membranes are moist. Cardiovascular: Normal rate, regular rhythm. No murmur Respiratory: Normal respiratory effort without tachypnea nor retractions. Breath sounds are clear  Gastrointestinal: Soft and nontender. No distention.   Musculoskeletal: Nontender with normal range of motion in all extremities.  Neurologic:  Normal speech and language. No gross focal neurologic deficits Skin:  Skin is warm, dry and intact.  Psychiatric: Mood and affect are normal.  ____________________________________________    EKG  EKG reviewed and interpreted by myself shows sinus tachycardia 106 bpm with a narrow QRS, normal axis, normal intervals, nonspecific no concerning ST changes.  ____________________________________________     RADIOLOGY  Chest x-ray negative  ____________________________________________   INITIAL IMPRESSION / ASSESSMENT AND PLAN / ED COURSE  Pertinent labs & imaging results that were available during my care of the patient were reviewed by me and considered in my medical decision making (see chart for details).  Patient presents to the emergency department for shortness of breath and nausea since 630 this morning.  EMS states initial blood pressure readings in the 200 per the fire department, now 137/85.  Patient states he is feeling much better.  We will check labs, chest x-ray, IV hydrate and continue to closely monitor.  Patient agreeable to plan of care.  Chest x-ray is negative, labs are largely within normal limits.  Repeat troponin remains negative.  Patient is feeling much better.  Blood pressure 123/76.  Is not entirely clear the cause of the patient's shortness of breath but it appears to be resolved at this time with a reassuring work-up.  I discussed with patient return precautions otherwise PCP follow-up.  Patient agreeable to plan of care.  ____________________________________________   FINAL CLINICAL IMPRESSION(S) / ED DIAGNOSES  Shortness of breath Nausea    Harvest Dark, MD 07/16/17 1130

## 2019-03-11 IMAGING — CR DG CHEST 2V
2 series · 2 of 2 positions shown · non-contrast
Comparison: 04/18/2010

CLINICAL DATA: Shortness of breath.  Chest tightness.

EXAM:
CHEST - 2 VIEW

[chest pa]
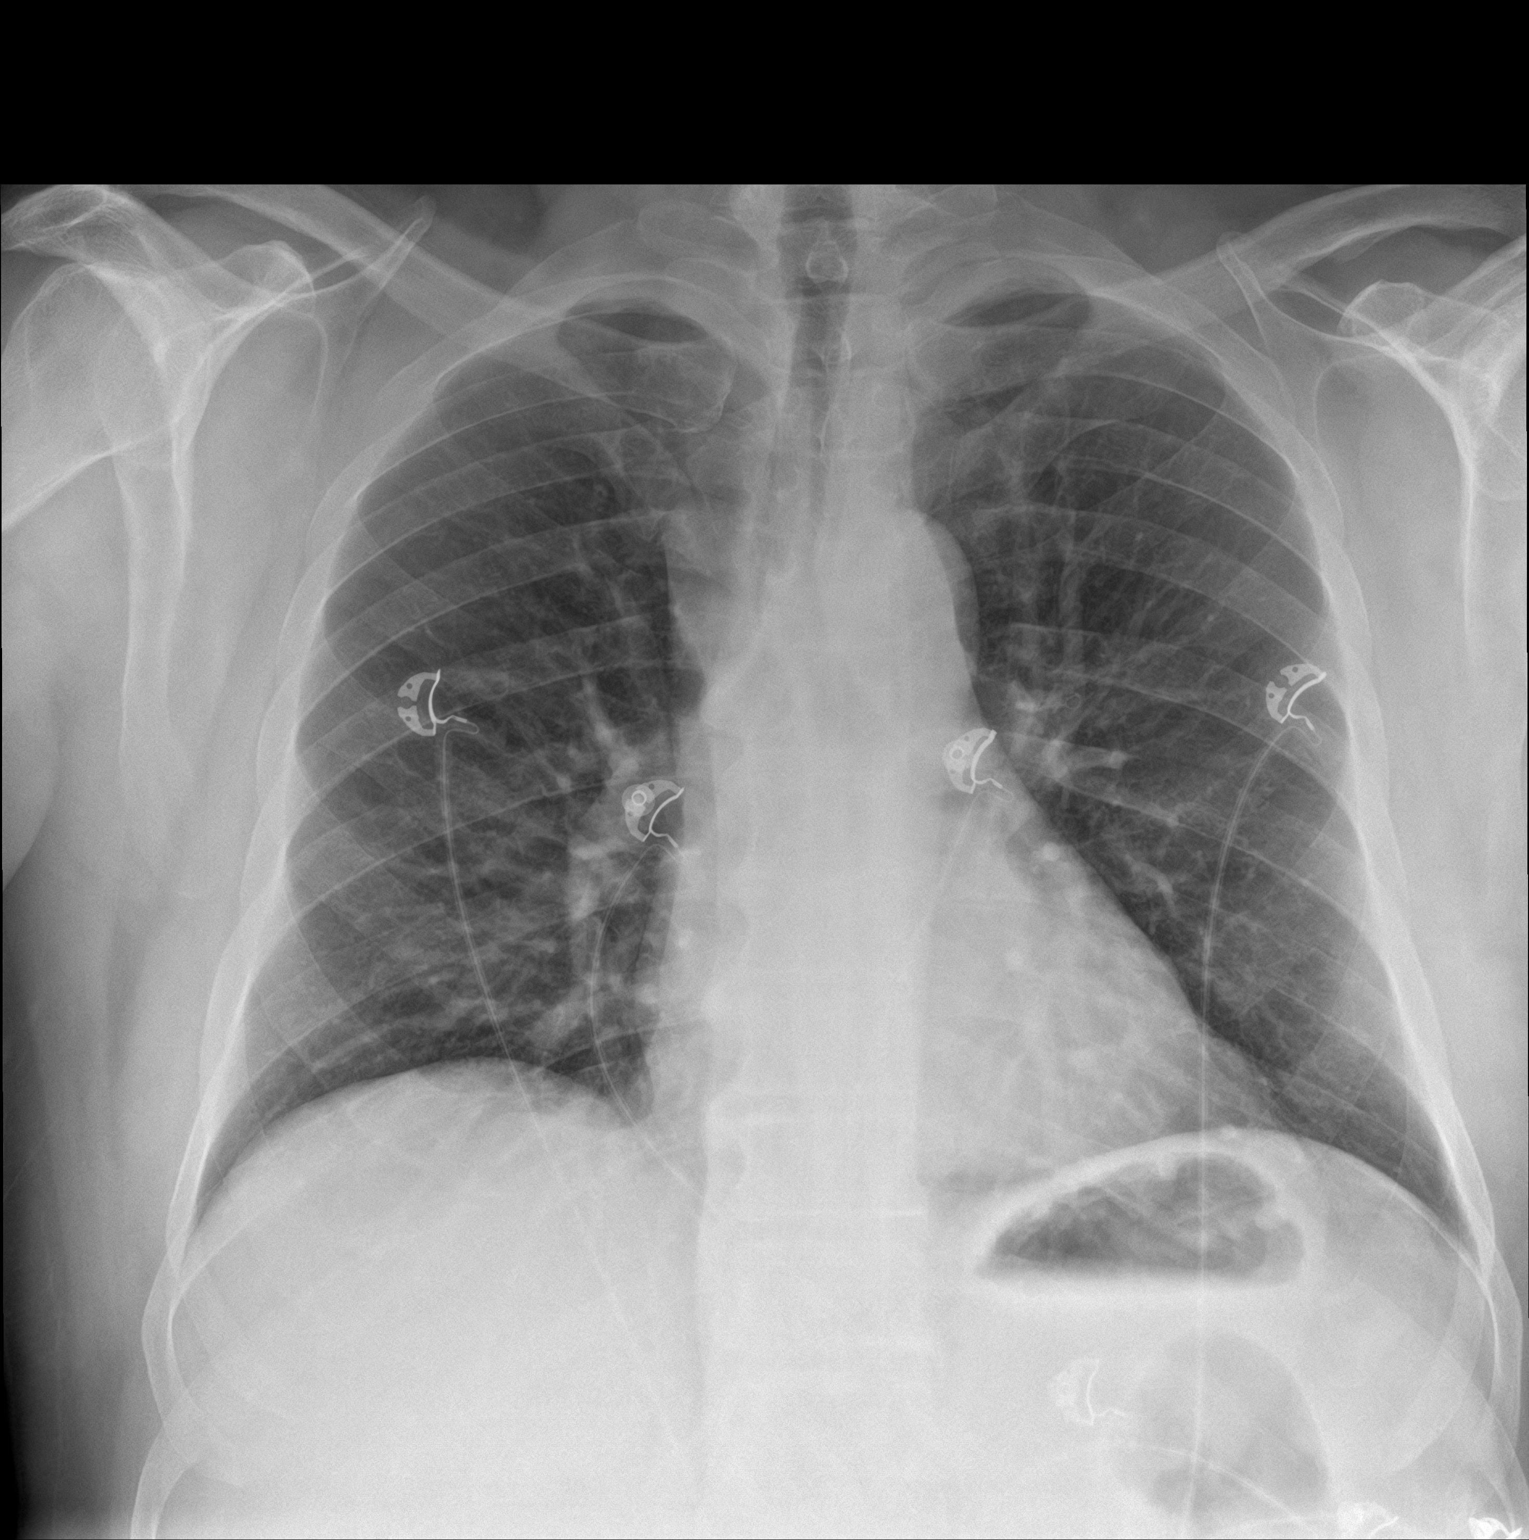

[chest lat]
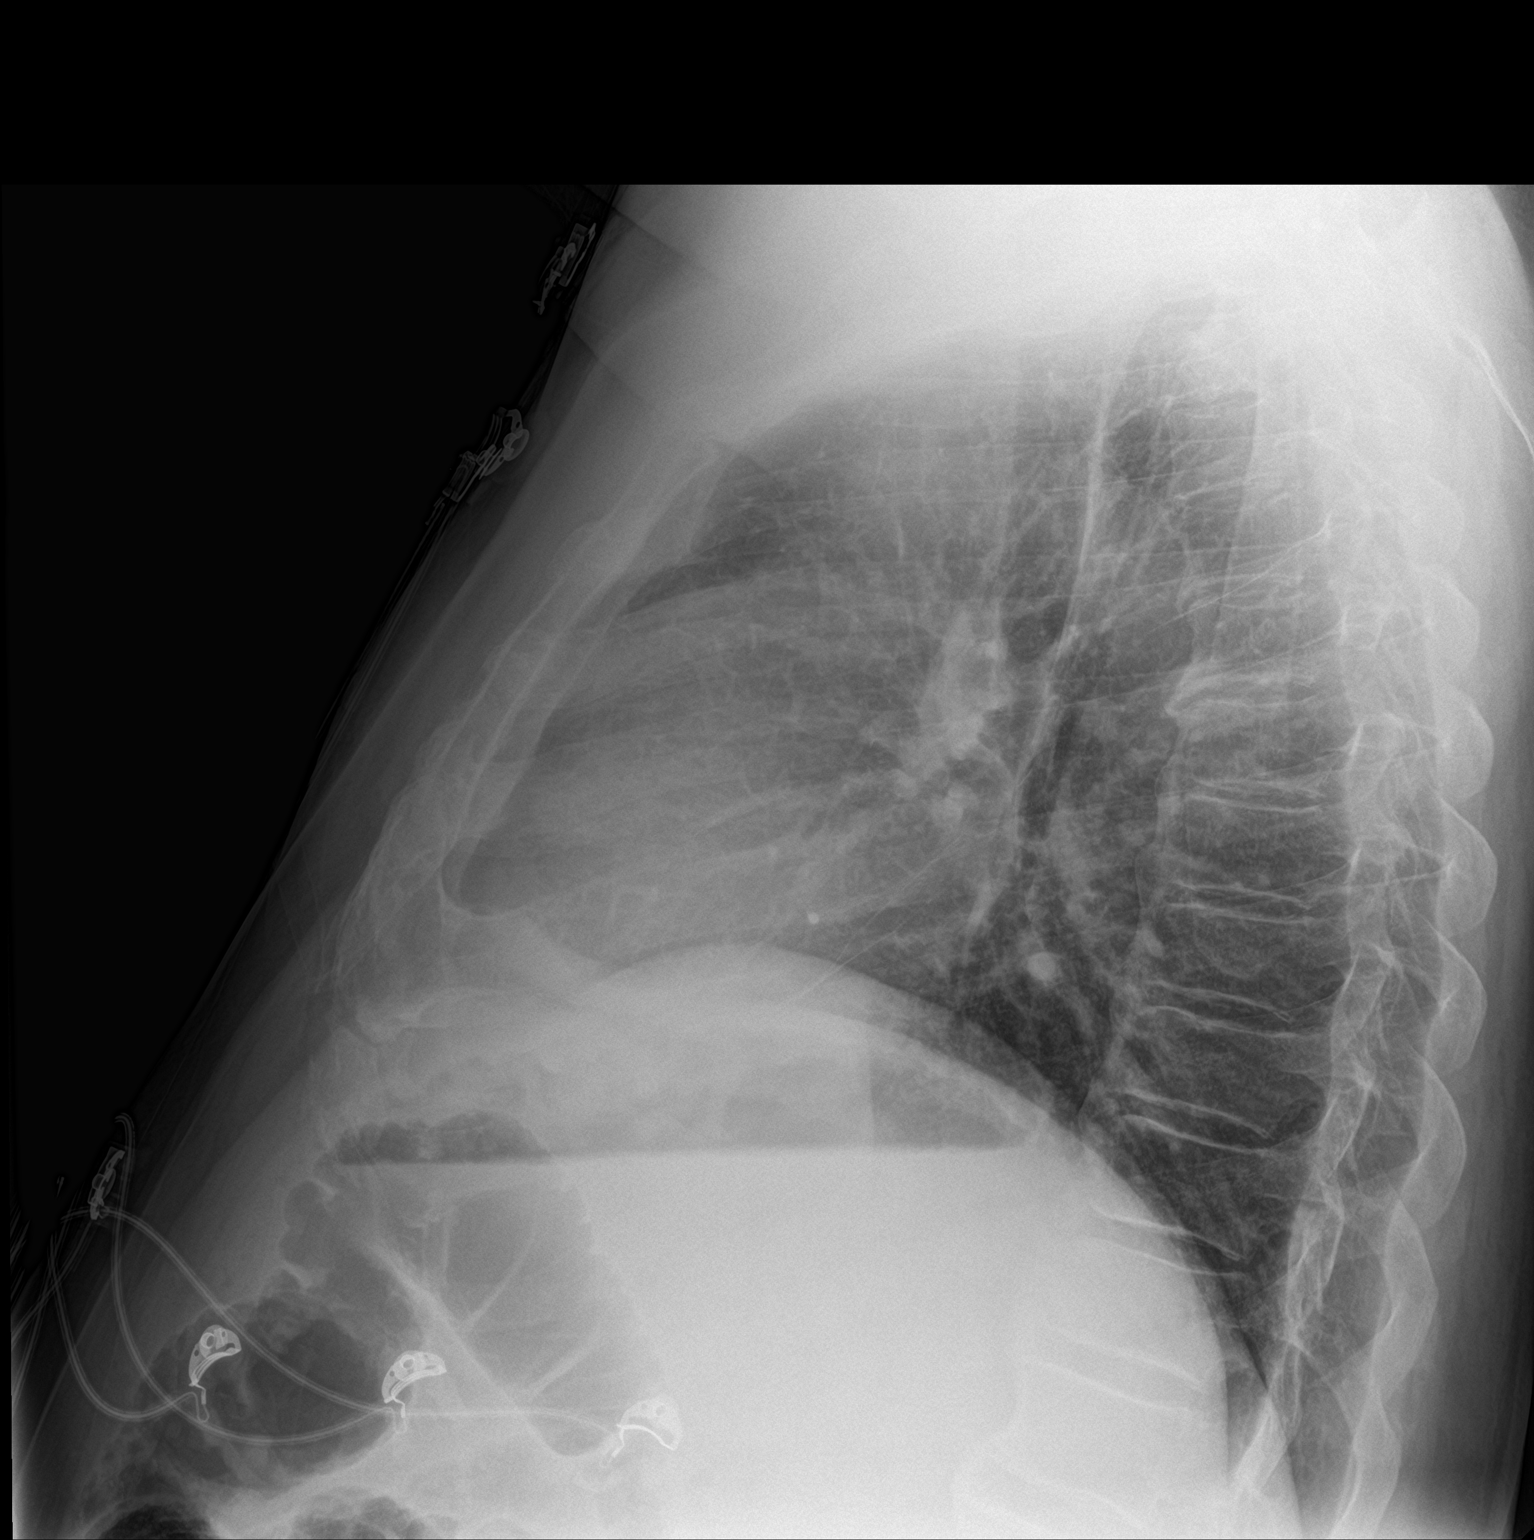

[2 of 2 positions shown; findings below may reference images not displayed]

FINDINGS: The heart size and mediastinal contours are within normal limits.
Both lungs are clear. The visualized skeletal structures are
unremarkable.
IMPRESSION: Normal exam.

## 2020-02-28 ENCOUNTER — Other Ambulatory Visit: Payer: Self-pay | Admitting: Orthopedic Surgery

## 2020-02-28 DIAGNOSIS — M25561 Pain in right knee: Secondary | ICD-10-CM

## 2020-02-28 DIAGNOSIS — M25461 Effusion, right knee: Secondary | ICD-10-CM

## 2020-03-07 ENCOUNTER — Ambulatory Visit: Payer: BLUE CROSS/BLUE SHIELD

## 2022-02-02 ENCOUNTER — Emergency Department: Payer: Medicare Other

## 2022-02-02 ENCOUNTER — Emergency Department
Admission: EM | Admit: 2022-02-02 | Discharge: 2022-02-02 | Disposition: A | Discharge status: Home / Self Care | Payer: Medicare Other | Attending: Emergency Medicine | Admitting: Emergency Medicine

## 2022-02-02 ENCOUNTER — Other Ambulatory Visit: Payer: Self-pay

## 2022-02-02 DIAGNOSIS — R441 Visual hallucinations: Secondary | ICD-10-CM | POA: Insufficient documentation

## 2022-02-02 DIAGNOSIS — R4182 Altered mental status, unspecified: Secondary | ICD-10-CM | POA: Insufficient documentation

## 2022-02-02 DIAGNOSIS — R7401 Elevation of levels of liver transaminase levels: Secondary | ICD-10-CM | POA: Diagnosis not present

## 2022-02-02 DIAGNOSIS — I10 Essential (primary) hypertension: Secondary | ICD-10-CM | POA: Diagnosis not present

## 2022-02-02 DIAGNOSIS — R443 Hallucinations, unspecified: Secondary | ICD-10-CM

## 2022-02-02 DIAGNOSIS — U071 COVID-19: Secondary | ICD-10-CM | POA: Insufficient documentation

## 2022-02-02 LAB — URINE DRUG SCREEN, QUALITATIVE (ARMC ONLY)
Amphetamines, Ur Screen: NOT DETECTED
Barbiturates, Ur Screen: NOT DETECTED
Benzodiazepine, Ur Scrn: NOT DETECTED
Cannabinoid 50 Ng, Ur ~~LOC~~: POSITIVE — AB
Cocaine Metabolite,Ur ~~LOC~~: NOT DETECTED
MDMA (Ecstasy)Ur Screen: NOT DETECTED
Methadone Scn, Ur: NOT DETECTED
Opiate, Ur Screen: NOT DETECTED
Phencyclidine (PCP) Ur S: NOT DETECTED
Tricyclic, Ur Screen: NOT DETECTED

## 2022-02-02 LAB — CBC WITH DIFFERENTIAL/PLATELET
Abs Immature Granulocytes: 0.02 10*3/uL (ref 0.00–0.07)
Basophils Absolute: 0 10*3/uL (ref 0.0–0.1)
Basophils Relative: 0 %
Eosinophils Absolute: 0.1 10*3/uL (ref 0.0–0.5)
Eosinophils Relative: 1 %
HCT: 46.5 % (ref 39.0–52.0)
Hemoglobin: 16.3 g/dL (ref 13.0–17.0)
Immature Granulocytes: 0 %
Lymphocytes Relative: 38 %
Lymphs Abs: 2.9 10*3/uL (ref 0.7–4.0)
MCH: 33.8 pg (ref 26.0–34.0)
MCHC: 35.1 g/dL (ref 30.0–36.0)
MCV: 96.5 fL (ref 80.0–100.0)
Monocytes Absolute: 0.8 10*3/uL (ref 0.1–1.0)
Monocytes Relative: 11 %
Neutro Abs: 3.9 10*3/uL (ref 1.7–7.7)
Neutrophils Relative %: 50 %
Platelets: 172 10*3/uL (ref 150–400)
RBC: 4.82 MIL/uL (ref 4.22–5.81)
RDW: 12.5 % (ref 11.5–15.5)
WBC: 7.7 10*3/uL (ref 4.0–10.5)
nRBC: 0 % (ref 0.0–0.2)

## 2022-02-02 LAB — URINALYSIS, ROUTINE W REFLEX MICROSCOPIC
Bilirubin Urine: NEGATIVE
Glucose, UA: NEGATIVE mg/dL
Hgb urine dipstick: NEGATIVE
Ketones, ur: NEGATIVE mg/dL
Leukocytes,Ua: NEGATIVE
Nitrite: NEGATIVE
Protein, ur: NEGATIVE mg/dL
Specific Gravity, Urine: 1.004 — ABNORMAL LOW (ref 1.005–1.030)
pH: 6 (ref 5.0–8.0)

## 2022-02-02 LAB — COMPREHENSIVE METABOLIC PANEL
ALT: 81 U/L — ABNORMAL HIGH (ref 0–44)
AST: 97 U/L — ABNORMAL HIGH (ref 15–41)
Albumin: 4.2 g/dL (ref 3.5–5.0)
Alkaline Phosphatase: 54 U/L (ref 38–126)
Anion gap: 8 (ref 5–15)
BUN: 9 mg/dL (ref 8–23)
CO2: 23 mmol/L (ref 22–32)
Calcium: 8.9 mg/dL (ref 8.9–10.3)
Chloride: 105 mmol/L (ref 98–111)
Creatinine, Ser: 0.72 mg/dL (ref 0.61–1.24)
GFR, Estimated: >60 mL/min (ref >60)
Glucose, Bld: 114 mg/dL — ABNORMAL HIGH (ref 70–99)
Potassium: 3.7 mmol/L (ref 3.5–5.1)
Sodium: 136 mmol/L (ref 135–145)
Total Bilirubin: 1.3 mg/dL — ABNORMAL HIGH (ref 0.3–1.2)
Total Protein: 8.1 g/dL (ref 6.5–8.1)

## 2022-02-02 LAB — ACETAMINOPHEN LEVEL: Acetaminophen (Tylenol), Serum: <10 ug/mL — ABNORMAL LOW (ref 10–30)

## 2022-02-02 LAB — SALICYLATE LEVEL: Salicylate Lvl: <7 mg/dL — ABNORMAL LOW (ref 7.0–30.0)

## 2022-02-02 LAB — CBG MONITORING, ED: Glucose-Capillary: 122 mg/dL — ABNORMAL HIGH (ref 70–99)

## 2022-02-02 LAB — ETHANOL: Alcohol, Ethyl (B): <10 mg/dL (ref <10)

## 2022-02-02 MED ORDER — LORAZEPAM 1 MG PO TABS
1.0000 mg | ORAL_TABLET | ORAL | Status: DC | PRN
  Administered 2022-02-02: 1 mg via ORAL
  Filled 2022-02-02: qty 1

## 2022-02-02 NOTE — ED Triage Notes (Signed)
Bgl 122

## 2022-02-02 NOTE — ED Triage Notes (Signed)
Patient reports hallucinations after taking paxlovid. Pt COVID +. Referred here by pcp for concern of stroke. Pt with no current neurological deficits. Pt alert and oriented x4 following commands. Breathing unlabored speaking in full sentences. Pt reports intermittent h/a x 3-4 wks, worse with covid dx. Pt is breathing unlabored speaking in full sentences. Symmetric chest rise and fall. NIH 0

## 2022-02-02 NOTE — ED Notes (Signed)
Pt to MRI

## 2022-02-02 NOTE — ED Provider Notes (Signed)
[facility] Provider Note    Event Date/Time   First MD Initiated Contact with Patient 02/02/22 0413     (approximate)   History   Chief Complaint Medication Reaction   HPI  Jeffery Meyer is a 66 y.o. male with past medical history of hypertension, hyperlipidemia, and anxiety who presents to the ED complaining of hallucinations.  Patient reports that over the past 24 hours he has been seeing things that are not there.  He describes visions of and "aquarium with squid swimming around."  He additionally states that he saw his dead wife standing next to him at home earlier this evening.  He denies hearing voices and has never had similar symptoms in the past.  He does state that he was diagnosed with COVID-19 2 days ago and started on Paxlovid.  When he asked his PCP if this could be related to the medication, he was told that he needed to come to the ED for stroke rule out.  He denies any facial droop, numbness, weakness, or speech changes.  He states his COVID symptoms are improving with cough and congestion ongoing but no chest pain, shortness of breath, fever, nausea, vomiting, or diarrhea.     Physical Exam   Triage Vital Signs: ED Triage Vitals  Enc Vitals Group     BP 02/02/22 0051 (!) 186/84     Pulse Rate 02/02/22 0051 88     Resp 02/02/22 0051 20     Temp 02/02/22 0052 98.2 F (36.8 C)     Temp Source 02/02/22 0051 Oral     SpO2 02/02/22 0051 95 %     Weight 02/02/22 0052 278 lb (126.1 kg)     Height 02/02/22 0052 '6\' 3"'$  (1.905 m)     Head Circumference --      Peak Flow --      Pain Score 02/02/22 0051 6     Pain Loc --      Pain Edu? --      Excl. in [place]? --     Most recent vital signs: Vitals:   02/02/22 0513 02/02/22 0530  BP:  137/75  Pulse:  70  Resp:  18  Temp: 98.2 F (36.8 C)   SpO2:  94%    Constitutional: Alert and oriented. Eyes: Conjunctivae are normal. Head: Atraumatic. Nose: No congestion/rhinnorhea. Mouth/Throat:  Mucous membranes are moist.  Cardiovascular: Normal rate, regular rhythm. Grossly normal heart sounds.  2+ radial pulses bilaterally. Respiratory: Normal respiratory effort.  No retractions. Lungs CTAB. Gastrointestinal: Soft and nontender. No distention. Musculoskeletal: No lower extremity tenderness nor edema.  Neurologic:  Normal speech and language. No gross focal neurologic deficits are appreciated.    ED Results / Procedures / Treatments   Labs (all labs ordered are listed, but only abnormal results are displayed) Labs Reviewed  COMPREHENSIVE METABOLIC PANEL - Abnormal; Notable for the following components:      Result Value   Glucose, Bld 114 (*)    AST 97 (*)    ALT 81 (*)    Total Bilirubin 1.3 (*)    All other components within normal limits  SALICYLATE LEVEL - Abnormal; Notable for the following components:   Salicylate Lvl <4.6 (*)    All other components within normal limits  ACETAMINOPHEN LEVEL - Abnormal; Notable for the following components:   Acetaminophen (Tylenol), Serum <10 (*)    All other components within normal limits  URINE DRUG SCREEN, QUALITATIVE ([facility] ONLY) -  Abnormal; Notable for the following components:   Cannabinoid 50 Ng, Ur Olivia Lopez de Gutierrez POSITIVE (*)    All other components within normal limits  URINALYSIS, ROUTINE W REFLEX MICROSCOPIC - Abnormal; Notable for the following components:   Color, Urine YELLOW (*)    APPearance CLEAR (*)    Specific Gravity, Urine 1.004 (*)    All other components within normal limits  CBG MONITORING, ED - Abnormal; Notable for the following components:   Glucose-Capillary 122 (*)    All other components within normal limits  CBC WITH DIFFERENTIAL/PLATELET  ETHANOL     EKG  ED ECG REPORT I, Blake Divine, the attending physician, personally viewed and interpreted this ECG.   Date: 02/02/2022  EKG Time: 00:56  Rate: 77  Rhythm: normal sinus rhythm  Axis: Normal  Intervals:none  ST&T Change:  None  RADIOLOGY CT head reviewed and interpreted by me with no hemorrhage or midline shift.  PROCEDURES:  Critical Care performed: No  Procedures   MEDICATIONS ORDERED IN ED: Medications  LORazepam (ATIVAN) tablet 1 mg (1 mg Oral Given 02/02/22 0551)     IMPRESSION / MDM / ASSESSMENT AND PLAN / ED COURSE  I reviewed the triage vital signs and the nursing notes.                              67 y.o. male with past medical history of hypertension, hyperlipidemia, and anxiety who presents to the ED with about 24 hours of visual hallucinations after recently started on Paxlovid  Patient's presentation is most consistent with acute presentation with potential threat to life or bodily function.  Differential diagnosis includes, but is not limited to, stroke, electrolyte abnormality, anemia, COVID-19, medication effect, anxiety.  Patient nontoxic-appearing and in no acute distress, vital signs are unremarkable.  Patient has no focal neurologic deficits on exam and CT head is negative for acute process.  Labs show no anemia or leukocytosis, electrolytes and renal function are unremarkable.  He does have a mild transaminitis with mild elevation in bilirubin, likely secondary to COVID-19 infection.  Overall low suspicion for stroke given reassuring exam and CT, however stroke can present with hallucinations and we will further assess with MRI.  If this is unremarkable, suspect hallucinations related to Paxlovid versus anxiety, as psychiatric symptoms have been reported with Paxlovid.  MRI brain is negative for acute process, patient is appropriate for discharge home with outpatient follow-up.  He has already stopped taking Paxlovid and was counseled to follow-up with his PCP, otherwise return to the ED for new or worsening symptoms.  Patient agrees with plan.      FINAL CLINICAL IMPRESSION(S) / ED DIAGNOSES   Final diagnoses:  COVID-19  Hallucinations     Rx / DC Orders   ED  Discharge Orders     None        Note:  This document was prepared using Dragon voice recognition software and may include unintentional dictation errors.   Blake Divine, MD 02/02/22 [phone]

## 2022-08-29 ENCOUNTER — Inpatient Hospital Stay
Admission: EM | Admit: 2022-08-29 | Discharge: 2022-09-01 | DRG: 190 | Disposition: A | Discharge status: Home / Self Care | Payer: Medicare Other | Attending: Internal Medicine | Admitting: Internal Medicine

## 2022-08-29 ENCOUNTER — Other Ambulatory Visit: Payer: Self-pay

## 2022-08-29 ENCOUNTER — Emergency Department: Payer: Medicare Other

## 2022-08-29 DIAGNOSIS — J189 Pneumonia, unspecified organism: Secondary | ICD-10-CM | POA: Diagnosis not present

## 2022-08-29 DIAGNOSIS — Z1152 Encounter for screening for COVID-19: Secondary | ICD-10-CM

## 2022-08-29 DIAGNOSIS — Z8249 Family history of ischemic heart disease and other diseases of the circulatory system: Secondary | ICD-10-CM

## 2022-08-29 DIAGNOSIS — R0902 Hypoxemia: Principal | ICD-10-CM

## 2022-08-29 DIAGNOSIS — Z6832 Body mass index (BMI) 32.0-32.9, adult: Secondary | ICD-10-CM

## 2022-08-29 DIAGNOSIS — Z8546 Personal history of malignant neoplasm of prostate: Secondary | ICD-10-CM

## 2022-08-29 DIAGNOSIS — Z79899 Other long term (current) drug therapy: Secondary | ICD-10-CM

## 2022-08-29 DIAGNOSIS — J441 Chronic obstructive pulmonary disease with (acute) exacerbation: Secondary | ICD-10-CM | POA: Diagnosis not present

## 2022-08-29 DIAGNOSIS — Z87891 Personal history of nicotine dependence: Secondary | ICD-10-CM

## 2022-08-29 DIAGNOSIS — J9601 Acute respiratory failure with hypoxia: Secondary | ICD-10-CM | POA: Diagnosis present

## 2022-08-29 DIAGNOSIS — F32A Depression, unspecified: Secondary | ICD-10-CM

## 2022-08-29 DIAGNOSIS — R0602 Shortness of breath: Secondary | ICD-10-CM

## 2022-08-29 DIAGNOSIS — E669 Obesity, unspecified: Secondary | ICD-10-CM | POA: Diagnosis present

## 2022-08-29 DIAGNOSIS — E872 Acidosis, unspecified: Secondary | ICD-10-CM | POA: Diagnosis present

## 2022-08-29 DIAGNOSIS — I10 Essential (primary) hypertension: Secondary | ICD-10-CM | POA: Diagnosis present

## 2022-08-29 LAB — BASIC METABOLIC PANEL
Anion gap: 10 (ref 5–15)
BUN: 6 mg/dL — ABNORMAL LOW (ref 8–23)
CO2: 22 mmol/L (ref 22–32)
Calcium: 8.7 mg/dL — ABNORMAL LOW (ref 8.9–10.3)
Chloride: 102 mmol/L (ref 98–111)
Creatinine, Ser: 0.89 mg/dL (ref 0.61–1.24)
GFR, Estimated: >60 mL/min (ref >60)
Glucose, Bld: 127 mg/dL — ABNORMAL HIGH (ref 70–99)
Potassium: 3.8 mmol/L (ref 3.5–5.1)
Sodium: 134 mmol/L — ABNORMAL LOW (ref 135–145)

## 2022-08-29 LAB — CBC
HCT: 41.8 % (ref 39.0–52.0)
Hemoglobin: 14.6 g/dL (ref 13.0–17.0)
MCH: 34 pg (ref 26.0–34.0)
MCHC: 34.9 g/dL (ref 30.0–36.0)
MCV: 97.4 fL (ref 80.0–100.0)
Platelets: 220 10*3/uL (ref 150–400)
RBC: 4.29 MIL/uL (ref 4.22–5.81)
RDW: 12.7 % (ref 11.5–15.5)
WBC: 12.2 10*3/uL — ABNORMAL HIGH (ref 4.0–10.5)
nRBC: 0 % (ref 0.0–0.2)

## 2022-08-29 LAB — TROPONIN I (HIGH SENSITIVITY): Troponin I (High Sensitivity): 5 ng/L (ref <18)

## 2022-08-29 MED ORDER — SODIUM CHLORIDE 0.9 % IV SOLN
500.0000 mg | Freq: Once | INTRAVENOUS | Status: AC
  Administered 2022-08-30: 500 mg via INTRAVENOUS
  Filled 2022-08-29: qty 5

## 2022-08-29 MED ORDER — SODIUM CHLORIDE 0.9 % IV SOLN
1.0000 g | Freq: Once | INTRAVENOUS | Status: AC
  Administered 2022-08-29: 1 g via INTRAVENOUS
  Filled 2022-08-29: qty 10

## 2022-08-29 MED ORDER — SODIUM CHLORIDE 0.9 % IV BOLUS
1000.0000 mL | Freq: Once | INTRAVENOUS | Status: AC
  Administered 2022-08-29: 1000 mL via INTRAVENOUS

## 2022-08-29 MED ORDER — IPRATROPIUM-ALBUTEROL 0.5-2.5 (3) MG/3ML IN SOLN
3.0000 mL | Freq: Once | RESPIRATORY_TRACT | Status: AC
  Administered 2022-08-29: 3 mL via RESPIRATORY_TRACT
  Filled 2022-08-29: qty 3

## 2022-08-29 MED ORDER — METHYLPREDNISOLONE SODIUM SUCC 125 MG IJ SOLR
125.0000 mg | Freq: Once | INTRAMUSCULAR | Status: AC
  Administered 2022-08-29: 125 mg via INTRAVENOUS
  Filled 2022-08-29: qty 2

## 2022-08-29 MED ORDER — HYDROCOD POLI-CHLORPHE POLI ER 10-8 MG/5ML PO SUER
5.0000 mL | Freq: Once | ORAL | Status: AC
  Administered 2022-08-29: 5 mL via ORAL
  Filled 2022-08-29: qty 5

## 2022-08-29 NOTE — ED Provider Notes (Signed)
[facility] Provider Note    Event Date/Time   First MD Initiated Contact with Patient 08/29/22 2303     (approximate)   History   Shortness of Breath   HPI  Jeffery Meyer is a 67 y.o. male  who presents to the ED from home with a chief complaint of sob. Patient reports being treated for bronchitis 1 month ago. Has had 2 rounds of steroids. C/o cough productive of white sputum x 1 week. Seen by his PCP with normal CXR. Reports increased cough and wheezing  Denies fever/chills, cp, abd pain, N/V or dizziness. No prior h/o lung disease.      Past Medical History   Past Medical History:  Diagnosis Date   Back pain    Hypertension    Knee pain    Prostate cancer [facility])      Active Problem List   Patient Active Problem List   Diagnosis Date Noted   CAP (community acquired pneumonia) 08/30/2022   Depression 08/30/2022   Community acquired pneumonia 08/30/2022   Insect bite of back 07/29/2016   Hyperglycemia 05/21/2016   Acute non-recurrent maxillary sinusitis 04/24/2016   Anxiety 04/24/2016   Hyperlipidemia 02/01/2016   Gout 02/01/2016   Hypertension 06/27/2015   Back pain 06/27/2015   Knee pain 06/27/2015     Past Surgical History   Past Surgical History:  Procedure Laterality Date   PROSTATE SURGERY       Home Medications   Prior to Admission medications   Medication Sig Start Date End Date Taking? Authorizing Provider  atenolol (TENORMIN) 50 MG tablet Take 1 tablet (50 mg total) by mouth daily. 10/22/16  Yes Ellyn Hack, MD  atorvastatin (LIPITOR) 10 MG tablet Take 1 tablet (10 mg total) by mouth daily. 10/22/16  Yes Ellyn Hack, MD  citalopram (CELEXA) 40 MG tablet Take 40 mg by mouth daily.   Yes [provider]  meclizine (ANTIVERT) 25 MG tablet Take 1 tablet (25 mg total) by mouth 3 (three) times daily as needed for dizziness. Patient not taking: Reported on 08/30/2022 07/16/17   Minna Antis, MD   meloxicam (MOBIC) 7.5 MG tablet Take 1 tablet (7.5 mg total) by mouth daily. Patient not taking: Reported on 08/30/2022 02/01/16   Ellyn Hack, MD  mometasone (NASONEX) 50 MCG/ACT nasal spray Place 2 sprays into the nose daily. 04/24/16   Ellyn Hack, MD     Allergies  Patient has no known allergies.   Family History   Family History  Problem Relation Age of Onset   Hypertension Mother    Hypertension Father    Hypertension Sister    Hypertension Brother      Physical Exam  Triage Vital Signs: ED Triage Vitals [08/29/22 2244]  Encounter Vitals Group     BP (!) 177/77     Systolic BP Percentile      Diastolic BP Percentile      Pulse Rate (!) 115     Resp (!) 24     Temp (!) 97.5 F (36.4 C)     Temp Source Oral     SpO2 91 %     Weight 260 lb (117.9 kg)     Height 6\' 3"  (1.905 m)     Head Circumference      Peak Flow      Pain Score      Pain Loc      Pain Education  Exclude from Growth Chart     Updated Vital Signs: BP (!) 151/80 (BP Location: Left Arm)   Pulse 99   Temp 98.3 F (36.8 C)   Resp 18   Ht 6\' 3"  (1.905 m)   Wt 117.9 kg   SpO2 98%   BMI 32.50 kg/m    General: Awake, mild distress.  CV:  Tachycardic. Good peripheral perfusion.  Resp:  Increased effort. +Retractions. Audible wheezing. Abd:  Nontender. No distention.  Other:  No pedal edema.   ED Results / Procedures / Treatments  Labs (all labs ordered are listed, but only abnormal results are displayed) Labs Reviewed  BASIC METABOLIC PANEL - Abnormal; Notable for the following components:      Result Value   Sodium 134 (*)    Glucose, Bld 127 (*)    BUN 6 (*)    Calcium 8.7 (*)    All other components within normal limits  CBC - Abnormal; Notable for the following components:   WBC 12.2 (*)    All other components within normal limits  LACTIC ACID, PLASMA - Abnormal; Notable for the following components:   Lactic Acid, Venous 2.0 (*)    All other components  within normal limits  CBC - Abnormal; Notable for the following components:   RBC 3.97 (*)    HCT 38.8 (*)    All other components within normal limits  BASIC METABOLIC PANEL - Abnormal; Notable for the following components:   Glucose, Bld 133 (*)    BUN 6 (*)    Calcium 8.8 (*)    All other components within normal limits  CBC - Abnormal; Notable for the following components:   RBC 4.10 (*)    All other components within normal limits  SARS CORONAVIRUS 2 BY RT PCR  CULTURE, BLOOD (ROUTINE X 2)  CULTURE, BLOOD (ROUTINE X 2)  RESPIRATORY PANEL BY PCR  LACTIC ACID, PLASMA  DIFFERENTIAL  CREATININE, SERUM  APTT  PROTIME-INR  HIV ANTIBODY (ROUTINE TESTING W REFLEX)  TROPONIN I (HIGH SENSITIVITY)  TROPONIN I (HIGH SENSITIVITY)     EKG  ED ECG REPORT I, Tyiana Hill J, the attending physician, personally viewed and interpreted this ECG.   Date: 08/29/2022  EKG Time: 2247  Rate: 106  Rhythm: sinus tachycardia  Axis: Normal  Intervals:none  ST&T Change: Nonspecific    RADIOLOGY I have independently visualized and interpreted patient's xray as well as noted the radiology interpretation:  CXR: right pna  Official radiology report(s): DG Chest Port 1 View  Result Date: 08/29/2022 CLINICAL DATA:  Shortness of breath EXAM: PORTABLE CHEST 1 VIEW COMPARISON:  07/16/2017 FINDINGS: Heart and mediastinal contours within normal limits. Increased opacity in the right infrahilar region. No confluent opacity on the left. No effusions or acute bony abnormality. IMPRESSION: Right infrahilar atelectasis or infiltrate. Electronically Signed   By: Charlett Nose M.D.   On: 08/29/2022 23:09     PROCEDURES:  Critical Care performed: Yes, see critical care procedure note(s)  CRITICAL CARE Performed by: Irean Hong   Total critical care time: 30 minutes  Critical care time was exclusive of separately billable procedures and treating other patients.  Critical care was necessary to treat  or prevent imminent or life-threatening deterioration.  Critical care was time spent personally by me on the following activities: development of treatment plan with patient and/or surrogate as well as nursing, discussions with consultants, evaluation of patient's response to treatment, examination of patient, obtaining history from patient or surrogate, ordering  and performing treatments and interventions, ordering and review of laboratory studies, ordering and review of radiographic studies, pulse oximetry and re-evaluation of patient's condition.   Marland Kitchen1-3 Lead EKG Interpretation  Performed by: Irean Hong, MD Authorized by: Irean Hong, MD     Interpretation: abnormal     ECG rate:  110   ECG rate assessment: tachycardic     Rhythm: sinus tachycardia     Ectopy: none     Conduction: normal   Comments:     Patient placed on cardiac monitor to evaluate for arrthymias    MEDICATIONS ORDERED IN ED: Medications  pantoprazole (PROTONIX) EC tablet 20 mg (has no administration in time range)  atenolol (TENORMIN) tablet 50 mg (has no administration in time range)  cefTRIAXone (ROCEPHIN) 1 g in sodium chloride 0.9 % 100 mL IVPB (has no administration in time range)  azithromycin (ZITHROMAX) 500 mg in sodium chloride 0.9 % 250 mL IVPB (has no administration in time range)  predniSONE (DELTASONE) tablet 40 mg (has no administration in time range)  ipratropium-albuterol (DUONEB) 0.5-2.5 (3) MG/3ML nebulizer solution 3 mL (3 mLs Nebulization Given 08/30/22 0328)  enoxaparin (LOVENOX) injection 60 mg (has no administration in time range)  acetaminophen (TYLENOL) tablet 650 mg (has no administration in time range)    Or  acetaminophen (TYLENOL) suppository 650 mg (has no administration in time range)  polyethylene glycol (MIRALAX / GLYCOLAX) packet 17 g (has no administration in time range)  sodium chloride flush (NS) 0.9 % injection 3 mL (3 mLs Intravenous Given 08/30/22 0039)  sodium chloride  0.9 % bolus 1,000 mL (0 mLs Intravenous Stopped 08/30/22 0118)  ipratropium-albuterol (DUONEB) 0.5-2.5 (3) MG/3ML nebulizer solution 3 mL (3 mLs Nebulization Given 08/29/22 2337)  methylPREDNISolone sodium succinate (SOLU-MEDROL) 125 mg/2 mL injection 125 mg (125 mg Intravenous Given 08/29/22 2337)  cefTRIAXone (ROCEPHIN) 1 g in sodium chloride 0.9 % 100 mL IVPB (0 g Intravenous Stopped 08/30/22 0008)  azithromycin (ZITHROMAX) 500 mg in sodium chloride 0.9 % 250 mL IVPB (0 mg Intravenous Stopped 08/30/22 0150)  chlorpheniramine-HYDROcodone (TUSSIONEX) 10-8 MG/5ML suspension 5 mL (5 mLs Oral Given 08/29/22 2339)     IMPRESSION / MDM / ASSESSMENT AND PLAN / ED COURSE  I reviewed the triage vital signs and the nursing notes.                             67y/o M presenting with sob. Differential includes, but is not limited to, viral syndrome, bronchitis including COPD exacerbation, pneumonia, reactive airway disease including asthma, CHF including exacerbation with or without pulmonary/interstitial edema, pneumothorax, ACS, thoracic trauma, and pulmonary embolism. I have personally reviewed patient's records and note his PCP office visit on 08/12/2022.  Patient's presentation is most consistent with acute presentation with potential threat to life or bodily function.  The patient is on the cardiac monitor to evaluate for evidence of arrhythmia and/or significant heart rate changes.  Laboratory results demonstrate mild leukocytosis with WBC 12.2, Normal electrolytes and troponin. CXR demonstrates CAP. Patient hypoxic, placed on 2L [place] oxygen. Will obtain blood cultures x 2, lactic acid, initiate IVF hydration, IV antibiotics, Solumedrol, duoneb. Anticipate hospitalization.      FINAL CLINICAL IMPRESSION(S) / ED DIAGNOSES   Final diagnoses:  Hypoxia  Shortness of breath  Community acquired pneumonia of right lower lobe of lung     Rx / DC Orders   ED Discharge Orders     None  Note:   This document was prepared using Dragon voice recognition software and may include unintentional dictation errors.   Irean Hong, MD 08/30/22 [phone]

## 2022-08-29 NOTE — ED Triage Notes (Signed)
Pt to ED via POV c/o SOB. Pt was told he had bronchitis a month ago and was given medication. SOB has been getting worse. Pt having trouble speaking in complete sentences. Pt coughing and wheezing in triage. O2 sat 91% in triage. Endorses CP as well

## 2022-08-30 ENCOUNTER — Inpatient Hospital Stay
Admit: 2022-08-30 | Discharge: 2022-08-30 | Disposition: A | Discharge status: Home / Self Care | Payer: Medicare Other | Attending: Internal Medicine | Admitting: Internal Medicine

## 2022-08-30 ENCOUNTER — Inpatient Hospital Stay: Payer: Medicare Other

## 2022-08-30 DIAGNOSIS — Z79899 Other long term (current) drug therapy: Secondary | ICD-10-CM | POA: Diagnosis not present

## 2022-08-30 DIAGNOSIS — F32A Depression, unspecified: Secondary | ICD-10-CM

## 2022-08-30 DIAGNOSIS — R0602 Shortness of breath: Secondary | ICD-10-CM

## 2022-08-30 DIAGNOSIS — E669 Obesity, unspecified: Secondary | ICD-10-CM | POA: Diagnosis present

## 2022-08-30 DIAGNOSIS — Z6832 Body mass index (BMI) 32.0-32.9, adult: Secondary | ICD-10-CM | POA: Diagnosis not present

## 2022-08-30 DIAGNOSIS — I509 Heart failure, unspecified: Secondary | ICD-10-CM | POA: Diagnosis not present

## 2022-08-30 DIAGNOSIS — Z8249 Family history of ischemic heart disease and other diseases of the circulatory system: Secondary | ICD-10-CM | POA: Diagnosis not present

## 2022-08-30 DIAGNOSIS — J189 Pneumonia, unspecified organism: Secondary | ICD-10-CM | POA: Diagnosis present

## 2022-08-30 DIAGNOSIS — J9601 Acute respiratory failure with hypoxia: Secondary | ICD-10-CM | POA: Diagnosis present

## 2022-08-30 DIAGNOSIS — I10 Essential (primary) hypertension: Secondary | ICD-10-CM | POA: Diagnosis present

## 2022-08-30 DIAGNOSIS — Z87891 Personal history of nicotine dependence: Secondary | ICD-10-CM | POA: Diagnosis not present

## 2022-08-30 DIAGNOSIS — Z8546 Personal history of malignant neoplasm of prostate: Secondary | ICD-10-CM | POA: Diagnosis not present

## 2022-08-30 DIAGNOSIS — J441 Chronic obstructive pulmonary disease with (acute) exacerbation: Secondary | ICD-10-CM | POA: Diagnosis present

## 2022-08-30 DIAGNOSIS — Z1152 Encounter for screening for COVID-19: Secondary | ICD-10-CM | POA: Diagnosis not present

## 2022-08-30 DIAGNOSIS — E872 Acidosis, unspecified: Secondary | ICD-10-CM | POA: Diagnosis present

## 2022-08-30 LAB — DIFFERENTIAL
Abs Immature Granulocytes: 0.03 10*3/uL (ref 0.00–0.07)
Basophils Absolute: 0.1 10*3/uL (ref 0.0–0.1)
Basophils Relative: 1 %
Eosinophils Absolute: 0.5 10*3/uL (ref 0.0–0.5)
Eosinophils Relative: 6 %
Immature Granulocytes: 0 %
Lymphocytes Relative: 20 %
Lymphs Abs: 1.6 10*3/uL (ref 0.7–4.0)
Monocytes Absolute: 0.4 10*3/uL (ref 0.1–1.0)
Monocytes Relative: 5 %
Neutro Abs: 5.3 10*3/uL (ref 1.7–7.7)
Neutrophils Relative %: 68 %

## 2022-08-30 LAB — BASIC METABOLIC PANEL
Anion gap: 7 (ref 5–15)
BUN: 6 mg/dL — ABNORMAL LOW (ref 8–23)
CO2: 25 mmol/L (ref 22–32)
Calcium: 8.8 mg/dL — ABNORMAL LOW (ref 8.9–10.3)
Chloride: 108 mmol/L (ref 98–111)
Creatinine, Ser: 0.79 mg/dL (ref 0.61–1.24)
GFR, Estimated: >60 mL/min (ref >60)
Glucose, Bld: 133 mg/dL — ABNORMAL HIGH (ref 70–99)
Potassium: 4.1 mmol/L (ref 3.5–5.1)
Sodium: 140 mmol/L (ref 135–145)

## 2022-08-30 LAB — ECHOCARDIOGRAM COMPLETE
AR max vel: 2.93 cm2
AV Area VTI: 3.29 cm2
AV Area mean vel: 2.72 cm2
AV Mean grad: 7 mmHg
AV Peak grad: 11 mmHg
Ao pk vel: 1.66 m/s
Area-P 1/2: 4.99 cm2
Height: 75 in
MV VTI: 4.35 cm2
S' Lateral: 3.8 cm
Weight: 4160 oz

## 2022-08-30 LAB — RESPIRATORY PANEL BY PCR

## 2022-08-30 LAB — CBC
HCT: 38.8 % — ABNORMAL LOW (ref 39.0–52.0)
HCT: 39.9 % (ref 39.0–52.0)
Hemoglobin: 13.3 g/dL (ref 13.0–17.0)
Hemoglobin: 13.9 g/dL (ref 13.0–17.0)
MCH: 33.5 pg (ref 26.0–34.0)
MCH: 33.9 pg (ref 26.0–34.0)
MCHC: 34.3 g/dL (ref 30.0–36.0)
MCHC: 34.8 g/dL (ref 30.0–36.0)
MCV: 97.3 fL (ref 80.0–100.0)
MCV: 97.7 fL (ref 80.0–100.0)
Platelets: 194 10*3/uL (ref 150–400)
Platelets: 195 10*3/uL (ref 150–400)
RBC: 3.97 MIL/uL — ABNORMAL LOW (ref 4.22–5.81)
RBC: 4.1 MIL/uL — ABNORMAL LOW (ref 4.22–5.81)
RDW: 12.7 % (ref 11.5–15.5)
RDW: 12.7 % (ref 11.5–15.5)
WBC: 7.3 10*3/uL (ref 4.0–10.5)
WBC: 7.9 10*3/uL (ref 4.0–10.5)
nRBC: 0 % (ref 0.0–0.2)
nRBC: 0 % (ref 0.0–0.2)

## 2022-08-30 LAB — PROTIME-INR
INR: 1.1 (ref 0.8–1.2)
Prothrombin Time: 14.3 seconds (ref 11.4–15.2)

## 2022-08-30 LAB — HIV ANTIBODY (ROUTINE TESTING W REFLEX): HIV Screen 4th Generation wRfx: NONREACTIVE

## 2022-08-30 LAB — APTT: aPTT: 30 seconds (ref 24–36)

## 2022-08-30 LAB — CREATININE, SERUM
Creatinine, Ser: 0.78 mg/dL (ref 0.61–1.24)
GFR, Estimated: >60 mL/min (ref >60)

## 2022-08-30 LAB — BRAIN NATRIURETIC PEPTIDE: B Natriuretic Peptide: 56 pg/mL (ref 0.0–100.0)

## 2022-08-30 LAB — TROPONIN I (HIGH SENSITIVITY): Troponin I (High Sensitivity): 5 ng/L (ref <18)

## 2022-08-30 LAB — STREP PNEUMONIAE URINARY ANTIGEN: Strep Pneumo Urinary Antigen: NEGATIVE

## 2022-08-30 LAB — PROCALCITONIN: Procalcitonin: <0.1 ng/mL

## 2022-08-30 MED ORDER — IPRATROPIUM-ALBUTEROL 0.5-2.5 (3) MG/3ML IN SOLN: 3.0000 mL | Freq: Four times a day (QID) | RESPIRATORY_TRACT | Status: DC

## 2022-08-30 MED ORDER — SODIUM CHLORIDE 0.9 % IV SOLN
1.0000 g | INTRAVENOUS | Status: DC
  Administered 2022-08-30 (×2): 1 g via INTRAVENOUS
  Filled 2022-08-30 (×2): qty 10

## 2022-08-30 MED ORDER — BUDESONIDE 0.25 MG/2ML IN SUSP
0.2500 mg | Freq: Two times a day (BID) | RESPIRATORY_TRACT | Status: DC
  Administered 2022-08-30 – 2022-09-01 (×5): 0.25 mg via RESPIRATORY_TRACT
  Filled 2022-08-30 (×5): qty 2

## 2022-08-30 MED ORDER — ORAL CARE MOUTH RINSE: 15.0000 mL | OROMUCOSAL | Status: DC | PRN

## 2022-08-30 MED ORDER — CITALOPRAM HYDROBROMIDE 10 MG PO TABS
40.0000 mg | ORAL_TABLET | Freq: Every day | ORAL | Status: DC
  Administered 2022-08-30 – 2022-09-01 (×3): 40 mg via ORAL
  Filled 2022-08-30 (×3): qty 4

## 2022-08-30 MED ORDER — ACETAMINOPHEN 650 MG RE SUPP: 650.0000 mg | Freq: Four times a day (QID) | RECTAL | Status: DC | PRN

## 2022-08-30 MED ORDER — FLUTICASONE PROPIONATE 50 MCG/ACT NA SUSP: 1.0000 | Freq: Every day | NASAL | Status: DC | PRN

## 2022-08-30 MED ORDER — METHYLPREDNISOLONE SODIUM SUCC 40 MG IJ SOLR
40.0000 mg | Freq: Two times a day (BID) | INTRAMUSCULAR | Status: DC
  Administered 2022-08-30: 40 mg via INTRAVENOUS
  Filled 2022-08-30: qty 1

## 2022-08-30 MED ORDER — IOHEXOL 350 MG/ML SOLN
75.0000 mL | Freq: Once | INTRAVENOUS | Status: AC | PRN
  Administered 2022-08-30: 75 mL via INTRAVENOUS

## 2022-08-30 MED ORDER — ACETAMINOPHEN 325 MG PO TABS
650.0000 mg | ORAL_TABLET | Freq: Four times a day (QID) | ORAL | Status: DC | PRN
  Administered 2022-08-30 – 2022-08-31 (×3): 650 mg via ORAL
  Filled 2022-08-30 (×3): qty 2

## 2022-08-30 MED ORDER — METHYLPREDNISOLONE SODIUM SUCC 40 MG IJ SOLR
40.0000 mg | Freq: Once | INTRAMUSCULAR | Status: AC
  Administered 2022-08-30: 40 mg via INTRAVENOUS
  Filled 2022-08-30: qty 1

## 2022-08-30 MED ORDER — METHYLPREDNISOLONE SODIUM SUCC 125 MG IJ SOLR
80.0000 mg | Freq: Every day | INTRAMUSCULAR | Status: DC
  Administered 2022-08-31: 80 mg via INTRAVENOUS
  Filled 2022-08-30: qty 2

## 2022-08-30 MED ORDER — ENOXAPARIN SODIUM 60 MG/0.6ML IJ SOSY
60.0000 mg | PREFILLED_SYRINGE | INTRAMUSCULAR | Status: DC
  Administered 2022-08-31 – 2022-09-01 (×2): 60 mg via SUBCUTANEOUS
  Filled 2022-08-30 (×2): qty 0.6

## 2022-08-30 MED ORDER — SODIUM CHLORIDE 0.9% FLUSH
3.0000 mL | Freq: Two times a day (BID) | INTRAVENOUS | Status: DC
  Administered 2022-08-30 – 2022-09-01 (×6): 3 mL via INTRAVENOUS

## 2022-08-30 MED ORDER — PREDNISONE 20 MG PO TABS
40.0000 mg | ORAL_TABLET | Freq: Every day | ORAL | Status: DC
  Administered 2022-08-30: 40 mg via ORAL
  Filled 2022-08-30: qty 2

## 2022-08-30 MED ORDER — PANTOPRAZOLE SODIUM 20 MG PO TBEC
20.0000 mg | DELAYED_RELEASE_TABLET | Freq: Every day | ORAL | Status: DC
  Administered 2022-08-30 – 2022-09-01 (×3): 20 mg via ORAL
  Filled 2022-08-30 (×3): qty 1

## 2022-08-30 MED ORDER — IPRATROPIUM-ALBUTEROL 0.5-2.5 (3) MG/3ML IN SOLN
3.0000 mL | RESPIRATORY_TRACT | Status: DC
  Administered 2022-08-30 (×3): 3 mL via RESPIRATORY_TRACT
  Filled 2022-08-30 (×3): qty 3

## 2022-08-30 MED ORDER — ATENOLOL 25 MG PO TABS
50.0000 mg | ORAL_TABLET | Freq: Every day | ORAL | Status: DC
  Administered 2022-08-30 – 2022-09-01 (×3): 50 mg via ORAL
  Filled 2022-08-30 (×3): qty 2

## 2022-08-30 MED ORDER — SODIUM CHLORIDE 0.9 % IV SOLN
500.0000 mg | INTRAVENOUS | Status: DC
  Administered 2022-08-31: 500 mg via INTRAVENOUS
  Filled 2022-08-30: qty 5

## 2022-08-30 MED ORDER — POLYETHYLENE GLYCOL 3350 17 G PO PACK: 17.0000 g | PACK | Freq: Every day | ORAL | Status: DC | PRN

## 2022-08-30 MED ORDER — ARFORMOTEROL TARTRATE 15 MCG/2ML IN NEBU
15.0000 ug | INHALATION_SOLUTION | Freq: Two times a day (BID) | RESPIRATORY_TRACT | Status: DC
  Administered 2022-08-30 – 2022-09-01 (×5): 15 ug via RESPIRATORY_TRACT
  Filled 2022-08-30 (×5): qty 2

## 2022-08-30 MED ORDER — IPRATROPIUM-ALBUTEROL 0.5-2.5 (3) MG/3ML IN SOLN
3.0000 mL | Freq: Four times a day (QID) | RESPIRATORY_TRACT | Status: DC
  Administered 2022-08-30 – 2022-08-31 (×4): 3 mL via RESPIRATORY_TRACT
  Filled 2022-08-30 (×3): qty 3

## 2022-08-30 NOTE — Consult Note (Signed)
PHARMACIST - PHYSICIAN COMMUNICATION   CONCERNING: Methylprednisolone IV    Current order: Methylprednisolone IV 40 mg BID     DESCRIPTION: Per [facility] Protocol:   IV methylprednisolone will be converted to either a q12h or q24h frequency with the same total daily dose (TDD).  Ordered Dose: 1 to 125 mg TDD; convert to: TDD q24h.  Ordered Dose: 126 to 250 mg TDD; convert to: TDD div q12h.  Ordered Dose: >250 mg TDD; DAW.  Order has been adjusted to: Methylprednisolone IV 80 mg daily  Celene Squibb, PharmD Clinical Pharmacist 08/30/2022 12:22 PM

## 2022-08-30 NOTE — Assessment & Plan Note (Addendum)
Patient describes almost a month-long syndrome of coughing and shortness of breath. Did get steroid rx as outpatient.  that has acutely worsened in the last 2 to 3 days and is associated with leukocytosis and tachycardia concerning for sepsis.  Cultures have been ordered, s/p ceftriaxone and azithromycin azithromycin.  Continue same.  Pending COVID-19 testing at this time. Check influenza panel.  Pending differential on CBC.  Patient was noted to have wheezing on initial presentation, during my exam there is very negligible wheezing, very hard to distinguish from upper airway breath sounds therefore this seems to have improved.  S/p Solu-Medrol in the ER.  I will continue with prednisone 40 daily.  concern here is if patient is having an asthma exacerbation that has previously not been diagnosed.  Patient does not report any new exposures such as new exposure to sick, new household new pets etc.  A/w hypoxia.   Lactic acidosis is negligible, patient has received a liter of fluid, looking nontoxic.  Likely related to hypoxia.  Will monitor clinically

## 2022-08-30 NOTE — Assessment & Plan Note (Signed)
This is a nonacute diagnosis of the patient.  Patient unable to tell what antidepressant he takes at home.  Wants to figure this out in the morning, we will plan on continuing same

## 2022-08-30 NOTE — Progress Notes (Signed)
*  PRELIMINARY RESULTS* Echocardiogram 2D Echocardiogram has been performed.  Carolyne Fiscal 08/30/2022, 12:57 PM

## 2022-08-30 NOTE — Progress Notes (Signed)
PROGRESS NOTE    Jeffery Meyer  FAO:[id number] DOB: 08/08/1955 DOA: 08/29/2022 PCP: Gracelyn Nurse, MD    Brief Narrative:  67 y.o. male with medical history significant of no chrnoic pulmonary issues. 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  Patient was in his usual state of health till approximately a month ago when he reports a new onset of sensation of a cough that was mostly dry with associated sensation of shortness of breath typically when he would walk outside the house.  There is no report of patient having any fevers leg swelling chest pain palpitations or loss of consciousness.  Patient has had waxing and waning course of the syndrome of cough and shortness of breath over the last month/30 days or so.  Patient has apparently completed 2 courses of steroids as an outpatient.  For the last 72 hours, patient's coughing and shortness of breath has worsened, patient has not been able to sleep for the last 7 days in peace.  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  Patient also reports having sweating for the last 48 hours but no documented fevers or rigors.    Assessment & Plan:   Principal Problem:   CAP (community acquired pneumonia) Active Problems:   Depression   Community acquired pneumonia  Right lower lobe infiltrate Unclear etiology.  Unable to exclude community-acquired pneumonia. Negative procalcitonin, no fever, no white count speak away from this Plan: Continue Rocephin and azithromycin for now Monitor vitals and fever curve Check strep pneumo, Legionella Check Fungitell Check respiratory viral panel Check CTA chest  Progressive shortness of breath Acute hypoxic respiratory failure This has been occurring over the past several  months.  Patient was initially evaluated in January treated with a short course of steroids and antibiotics.  Stated symptoms improved initially but once he was done with the steroid course they recurred.  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  Scheduled nebulizers, IV steroids, wean oxygen as tolerated.  Check echocardiogram.  CTA chest as above rule out PE  Essential hypertension PTA atenolol  Depression PTA Celexa  Obesity BMI 32.5.  This complicates overall care and prognosis  DVT prophylaxis: SQ Lovenox Code Status: Full Family Communication: None Disposition Plan: Status is: Inpatient Remains inpatient appropriate because: Progressive dyspnea of unclear etiology.  Right lower lobe infiltrate on IV antibiotics   Level of care: Med-Surg  Consultants:  None Procedures:  None  Antimicrobials: Rocephin Azithromycin   Subjective: Seen and examined.  Resting in bed.  Appears mildly short of breath.  No pain complaints.  Objective: Vitals:   08/30/22 0128 08/30/22 0328 08/30/22 0741 08/30/22 0755  BP: (!) 151/80  (!) 150/82   Pulse: 99  (!) 103 97  Resp: 18  16 18   Temp: 98.3 F (36.8 C)  97.9 F (36.6 C)   TempSrc:      SpO2: 97% 98% 96% 98%  Weight:      Height:        Intake/Output Summary (Last 24 hours) at 08/30/2022 1050 Last data filed at 08/30/2022 0118 Gross per 24 hour  Intake 1100 ml  Output --  Net 1100 ml  Filed Weights   08/29/22 2244  Weight: 117.9 kg    Examination:  General exam: NAD Respiratory system: Lung sounds bilaterally.  Normal work of breathing.  2 L Cardiovascular system: S1-S2, RRR, no murmurs, no pedal edema Gastrointestinal system: Obese, soft, NT/ND, normal bowel sounds Central nervous system: Alert and oriented. No focal neurological deficits. Extremities: Symmetric 5 x 5 power. Skin: No rashes, lesions or ulcers Psychiatry:  Judgement and insight appear normal. Mood & affect appropriate.     Data Reviewed: I have personally reviewed following labs and imaging studies  CBC: Recent Labs  Lab 08/29/22 2251 08/30/22 0039 08/30/22 0319  WBC 12.2* 7.9 7.3  NEUTROABS  --  5.3  --   HGB 14.6 13.3 13.9  HCT 41.8 38.8* 39.9  MCV 97.4 97.7 97.3  PLT 220 195 194   Basic Metabolic Panel: Recent Labs  Lab 08/29/22 2251 08/30/22 0039 08/30/22 0319  NA 134*  --  140  K 3.8  --  4.1  CL 102  --  108  CO2 22  --  25  GLUCOSE 127*  --  133*  BUN 6*  --  6*  CREATININE 0.89 0.78 0.79  CALCIUM 8.7*  --  8.8*   GFR: Estimated Creatinine Clearance: 124.1 mL/min (by C-G formula based on SCr of 0.79 mg/dL). Liver Function Tests: No results for input(s): "AST", "ALT", "ALKPHOS", "BILITOT", "PROT", "ALBUMIN" in the last 168 hours. No results for input(s): "LIPASE", "AMYLASE" in the last 168 hours. No results for input(s): "AMMONIA" in the last 168 hours. Coagulation Profile: Recent Labs  Lab 08/30/22 0319  INR 1.1   Cardiac Enzymes: No results for input(s): "CKTOTAL", "CKMB", "CKMBINDEX", "TROPONINI" in the last 168 hours. BNP (last 3 results) No results for input(s): "PROBNP" in the last 8760 hours. HbA1C: No results for input(s): "HGBA1C" in the last 72 hours. CBG: No results for input(s): "GLUCAP" in the last 168 hours. Lipid Profile: No results for input(s): "CHOL", "HDL", "LDLCALC", "TRIG", "CHOLHDL", "LDLDIRECT" in the last 72 hours. Thyroid Function Tests: No results for input(s): "TSH", "T4TOTAL", "FREET4", "T3FREE", "THYROIDAB" in the last 72 hours. Anemia Panel: No results for input(s): "VITAMINB12", "FOLATE", "FERRITIN", "TIBC", "IRON", "RETICCTPCT" in the last 72 hours. Sepsis Labs: Recent Labs  Lab 08/29/22 2335 08/30/22 0039 08/30/22 0319  PROCALCITON  --   --  <0.10  LATICACIDVEN 2.0* 1.8  --     Recent Results (from the past 240 hour(s))  Culture, blood (routine x 2)      Status: None (Preliminary result)   Collection Time: 08/29/22 11:35 PM   Specimen: BLOOD  Result Value Ref Range Status   Specimen Description BLOOD  RAC  Final   Special Requests   Final    BOTTLES DRAWN AEROBIC AND ANAEROBIC Blood Culture adequate volume   Culture   Final    NO GROWTH < 12 HOURS Performed at [facility], [address] [place], [place] [address]    Report Status PENDING  Incomplete  Culture, blood (routine x 2)     Status: None (Preliminary result)   Collection Time: 08/29/22 11:35 PM   Specimen: BLOOD  Result Value Ref Range Status   Specimen Description BLOOD  RH  Final   Special Requests   Final    BOTTLES DRAWN AEROBIC AND ANAEROBIC Blood Culture adequate volume   Culture   Final    NO GROWTH < 12 HOURS Performed at [facility], [address], [place], [place] [address]  Report Status PENDING  Incomplete  SARS Coronavirus 2 by RT PCR (hospital order, performed in [facility] hospital lab) *cepheid single result test* Anterior Nasal Swab     Status: None   Collection Time: 08/29/22 11:35 PM   Specimen: Anterior Nasal Swab  Result Value Ref Range Status   SARS Coronavirus 2 by RT PCR NEGATIVE NEGATIVE Final    Comment: (NOTE) SARS-CoV-2 target nucleic acids are NOT DETECTED.  The SARS-CoV-2 RNA is generally detectable in upper and lower respiratory specimens during the acute phase of infection. The lowest concentration of SARS-CoV-2 viral copies this assay can detect is 250 copies / mL. A negative result does not preclude SARS-CoV-2 infection and should not be used as the sole basis for treatment or other patient management decisions.  A negative result may occur with improper specimen collection / handling, submission of specimen other than nasopharyngeal swab, presence of viral mutation(s) within the areas targeted by this assay, and inadequate number of viral copies (<250 copies / mL). A negative result must be combined with  clinical observations, patient history, and epidemiological information.  Fact Sheet for Patients:   RoadLapTop.co.za  Fact Sheet for Healthcare Providers: http://kim-miller.com/  This test is not yet approved or  cleared by the [place] FDA and has been authorized for detection and/or diagnosis of SARS-CoV-2 by FDA under an Emergency Use Authorization (EUA).  This EUA will remain in effect (meaning this test can be used) for the duration of the COVID-19 declaration under Section 564(b)(1) of the Act, 21 U.S.C. section 360bbb-3(b)(1), unless the authorization is terminated or revoked sooner.  Performed at [facility], [address], [place], [place] [address]          Radiology Studies: CT Angio Chest Pulmonary Embolism (PE) W or WO Contrast  Result Date: 08/30/2022 CLINICAL DATA:  Pulmonary embolism suspected, high probability. Shortness breath and cough. EXAM: CT ANGIOGRAPHY CHEST WITH CONTRAST TECHNIQUE: Multidetector CT imaging of the chest was performed using the standard protocol during bolus administration of intravenous contrast. Multiplanar CT image reconstructions and MIPs were obtained to evaluate the vascular anatomy. RADIATION DOSE REDUCTION: This exam was performed according to the departmental dose-optimization program which includes automated exposure control, adjustment of the mA and/or kV according to patient size and/or use of iterative reconstruction technique. CONTRAST:  75mL OMNIPAQUE IOHEXOL 350 MG/ML SOLN COMPARISON:  One-view chest x-ray 08/29/2022 FINDINGS: Cardiovascular: The heart size is normal. Coronary artery calcifications are present. No significant pericardial effusion is present. Minimal atherosclerotic calcifications are present in the aorta and branch vessels. No aneurysm is present. Great vessel origins are within normal limits. Pulmonary artery opacification is satisfactory. No focal  filling defects are present to suggest pulmonary embolus. Pulmonary artery size normal. Mediastinum/Nodes: No enlarged mediastinal, hilar, or axillary lymph nodes. Thyroid gland, trachea, and esophagus demonstrate no significant findings. Lungs/Pleura: Lungs are clear. No pleural effusion or pneumothorax. Upper Abdomen: The visualized upper abdomen unremarkable. Musculoskeletal: Vertebral body heights and alignment are normal. Fused anterior osteophytes span across multiple levels. No focal osseous lesions present. Ribs and sternum are within normal limits. Review of the MIP images confirms the above findings. IMPRESSION: 1. No pulmonary embolus. 2. Coronary artery disease. 3. No acute or focal lesion to explain the patient's symptoms. Electronically Signed   By: Marin Roberts M.D.   On: 08/30/2022 10:34   DG Chest Port 1 View  Result Date: 08/29/2022 CLINICAL DATA:  Shortness of breath EXAM: PORTABLE CHEST 1 VIEW COMPARISON:  07/16/2017  FINDINGS: Heart and mediastinal contours within normal limits. Increased opacity in the right infrahilar region. No confluent opacity on the left. No effusions or acute bony abnormality. IMPRESSION: Right infrahilar atelectasis or infiltrate. Electronically Signed   By: Charlett Nose M.D.   On: 08/29/2022 23:09        Scheduled Meds:  arformoterol  15 mcg Nebulization BID   atenolol  50 mg Oral Daily   budesonide (PULMICORT) nebulizer solution  0.25 mg Nebulization BID   [START ON 08/31/2022] enoxaparin (LOVENOX) injection  60 mg Subcutaneous Q24H   ipratropium-albuterol  3 mL Nebulization Q6H   methylPREDNISolone (SOLU-MEDROL) injection  40 mg Intravenous Q12H   pantoprazole  20 mg Oral Daily   sodium chloride flush  3 mL Intravenous Q12H   Continuous Infusions:  [START ON 08/31/2022] azithromycin     cefTRIAXone (ROCEPHIN)  IV       LOS: 0 days     Tresa Moore, MD [facility]   If 7PM-7AM, please contact  night-coverage  08/30/2022, 10:50 AM

## 2022-08-30 NOTE — ED Notes (Signed)
ED TO INPATIENT HANDOFF REPORT  ED Nurse Name and Phone #: Lowella Petties Name/Age/Gender Jeffery Meyer 67 y.o. male Room/Bed: ED10A/ED10A  Code Status   Code Status: Full Code  Home/SNF/Other Home Patient oriented to: self, place, time, and situation Is this baseline? Yes   Triage Complete: Triage complete  Chief Complaint Community acquired pneumonia [J18.9]  Triage Note Pt to ED via POV c/o SOB. Pt was told he had bronchitis a month ago and was given medication. SOB has been getting worse. Pt having trouble speaking in complete sentences. Pt coughing and wheezing in triage. O2 sat 91% in triage. Endorses CP as well   Allergies No Known Allergies  Level of Care/Admitting Diagnosis ED Disposition     ED Disposition  Admit   Condition  --   Comment  Hospital Area: [facility] [[id number]]  Level of Care: Med-Surg [16]  Covid Evaluation: Symptomatic Person Under Investigation (PUI) or recent exposure (last 10 days) *Testing Required*  Diagnosis: Community acquired pneumonia [409811]  Admitting Physician: Nolberto Hanlon [[id number]]  Attending Physician: Nolberto Hanlon [[id number]]  Certification:: I certify this patient will need inpatient services for at least 2 midnights  Estimated Length of Stay: 3          B Medical/Surgery History Past Medical History:  Diagnosis Date   Back pain    Hypertension    Knee pain    Prostate cancer [facility])    Past Surgical History:  Procedure Laterality Date   PROSTATE SURGERY       A IV Location/Drains/Wounds Patient Lines/Drains/Airways Status     Active Line/Drains/Airways     Name Placement date Placement time Site Days   Peripheral IV 08/29/22 20 G Right Antecubital 08/29/22  2300  Antecubital  1            Intake/Output Last 24 hours  Intake/Output Summary (Last 24 hours) at 08/30/2022 0053 Last data filed at 08/30/2022 0008 Gross per 24 hour  Intake 100 ml  Output --  Net 100 ml     Labs/Imaging Results for orders placed or performed during the hospital encounter of 08/29/22 (from the past 48 hour(s))  Basic metabolic panel     Status: Abnormal   Collection Time: 08/29/22 10:51 PM  Result Value Ref Range   Sodium 134 (L) 135 - 145 mmol/L   Potassium 3.8 3.5 - 5.1 mmol/L   Chloride 102 98 - 111 mmol/L   CO2 22 22 - 32 mmol/L   Glucose, Bld 127 (H) 70 - 99 mg/dL    Comment: Glucose reference range applies only to samples taken after fasting for at least 8 hours.   BUN 6 (L) 8 - 23 mg/dL   Creatinine, Ser 6.57 0.61 - 1.24 mg/dL   Calcium 8.7 (L) 8.9 - 10.3 mg/dL   GFR, Estimated >84 >69 mL/min    Comment: (NOTE) Calculated using the CKD-EPI Creatinine Equation (2021)    Anion gap 10 5 - 15    Comment: Performed at [facility], [address] [place], [place] [address]  CBC     Status: Abnormal   Collection Time: 08/29/22 10:51 PM  Result Value Ref Range   WBC 12.2 (H) 4.0 - 10.5 K/uL   RBC 4.29 4.22 - 5.81 MIL/uL   Hemoglobin 14.6 13.0 - 17.0 g/dL   HCT 84.1 32.4 - 40.1 %   MCV 97.4 80.0 - 100.0 fL   MCH 34.0 26.0 - 34.0 pg   MCHC 34.9  30.0 - 36.0 g/dL   RDW 29.5 28.4 - 13.2 %   Platelets 220 150 - 400 K/uL   nRBC 0.0 0.0 - 0.2 %    Comment: Performed at [facility], [address] [place], [place] [address]  Troponin I (High Sensitivity)     Status: None   Collection Time: 08/29/22 10:51 PM  Result Value Ref Range   Troponin I (High Sensitivity) 5 <18 ng/L    Comment: (NOTE) Elevated high sensitivity troponin I (hsTnI) values and significant  changes across serial measurements may suggest ACS but many other  chronic and acute conditions are known to elevate hsTnI results.  Refer to the "Links" section for chest pain algorithms and additional  guidance. Performed at [facility], [address] [place], [place] [address]   Lactic acid, plasma     Status: Abnormal   Collection Time: 08/29/22 11:35 PM  Result  Value Ref Range   Lactic Acid, Venous 2.0 (HH) 0.5 - 1.9 mmol/L    Comment: CRITICAL RESULT CALLED TO, READ BACK BY AND VERIFIED WITH Kayl Stogdill @0017  ON 08/30/22 SKL Performed at [facility], [address] [place], [place] [address]   SARS Coronavirus 2 by RT PCR (hospital order, performed in [facility] hospital lab) *cepheid single result test* Anterior Nasal Swab     Status: None   Collection Time: 08/29/22 11:35 PM   Specimen: Anterior Nasal Swab  Result Value Ref Range   SARS Coronavirus 2 by RT PCR NEGATIVE NEGATIVE    Comment: (NOTE) SARS-CoV-2 target nucleic acids are NOT DETECTED.  The SARS-CoV-2 RNA is generally detectable in upper and lower respiratory specimens during the acute phase of infection. The lowest concentration of SARS-CoV-2 viral copies this assay can detect is 250 copies / mL. A negative result does not preclude SARS-CoV-2 infection and should not be used as the sole basis for treatment or other patient management decisions.  A negative result may occur with improper specimen collection / handling, submission of specimen other than nasopharyngeal swab, presence of viral mutation(s) within the areas targeted by this assay, and inadequate number of viral copies (<250 copies / mL). A negative result must be combined with clinical observations, patient history, and epidemiological information.  Fact Sheet for Patients:   RoadLapTop.co.za  Fact Sheet for Healthcare Providers: http://kim-miller.com/  This test is not yet approved or  cleared by the [place] FDA and has been authorized for detection and/or diagnosis of SARS-CoV-2 by FDA under an Emergency Use Authorization (EUA).  This EUA will remain in effect (meaning this test can be used) for the duration of the COVID-19 declaration under Section 564(b)(1) of the Act, 21 U.S.C. section 360bbb-3(b)(1), unless the authorization is terminated  or revoked sooner.  Performed at [facility], [address] [place], [place] [address]   Differential     Status: None   Collection Time: 08/30/22 12:39 AM  Result Value Ref Range   Neutrophils Relative % 68 %   Neutro Abs 5.3 1.7 - 7.7 K/uL   Lymphocytes Relative 20 %   Lymphs Abs 1.6 0.7 - 4.0 K/uL   Monocytes Relative 5 %   Monocytes Absolute 0.4 0.1 - 1.0 K/uL   Eosinophils Relative 6 %   Eosinophils Absolute 0.5 0.0 - 0.5 K/uL   Basophils Relative 1 %   Basophils Absolute 0.1 0.0 - 0.1 K/uL   Immature Granulocytes 0 %   Abs Immature Granulocytes 0.03 0.00 - 0.07 K/uL  Comment: Performed at [facility], [address] [place], [place] [address]  CBC     Status: Abnormal   Collection Time: 08/30/22 12:39 AM  Result Value Ref Range   WBC 7.9 4.0 - 10.5 K/uL   RBC 3.97 (L) 4.22 - 5.81 MIL/uL   Hemoglobin 13.3 13.0 - 17.0 g/dL   HCT 60.6 (L) 30.1 - 60.1 %   MCV 97.7 80.0 - 100.0 fL   MCH 33.5 26.0 - 34.0 pg   MCHC 34.3 30.0 - 36.0 g/dL   RDW 09.3 23.5 - 57.3 %   Platelets 195 150 - 400 K/uL   nRBC 0.0 0.0 - 0.2 %    Comment: Performed at [facility], [address] [place], [place] [address]   DG Chest Port 1 View  Result Date: 08/29/2022 CLINICAL DATA:  Shortness of breath EXAM: PORTABLE CHEST 1 VIEW COMPARISON:  07/16/2017 FINDINGS: Heart and mediastinal contours within normal limits. Increased opacity in the right infrahilar region. No confluent opacity on the left. No effusions or acute bony abnormality. IMPRESSION: Right infrahilar atelectasis or infiltrate. Electronically Signed   By: Charlett Nose M.D.   On: 08/29/2022 23:09    Pending Labs Unresulted Labs (From admission, onward)     Start     Ordered   09/06/22 0500  Creatinine, serum  (enoxaparin (LOVENOX)    CrCl >/= 30 ml/min)  Weekly,   STAT     Comments: while on enoxaparin therapy    08/30/22 0028   08/30/22 0500  APTT  Tomorrow morning,   R        08/30/22 0028    08/30/22 0500  Protime-INR  Tomorrow morning,   R        08/30/22 0028   08/30/22 0500  Basic metabolic panel  Tomorrow morning,   R        08/30/22 0028   08/30/22 0500  CBC  Tomorrow morning,   R        08/30/22 0028   08/30/22 0028  Creatinine, serum  (enoxaparin (LOVENOX)    CrCl >/= 30 ml/min)  Once,   R       Comments: Baseline for enoxaparin therapy IF NOT ALREADY DRAWN.    08/30/22 0028   08/30/22 0028  HIV Antibody (routine testing w rflx)  (HIV Antibody (Routine testing w reflex) panel)  Once,   R        08/30/22 0028   08/30/22 0027  Respiratory (~20 pathogens) panel by PCR  (Respiratory panel by PCR (~20 pathogens, ~24 hr TAT)  w precautions)  Once,   R        08/30/22 0028   08/29/22 2313  Culture, blood (routine x 2)  BLOOD CULTURE X 2,   STAT      08/29/22 2313   08/29/22 2313  Lactic acid, plasma  Now then every 2 hours,   STAT      08/29/22 2313            Vitals/Pain Today's Vitals   08/29/22 2254 08/29/22 2255 08/29/22 2330 08/30/22 0000  BP: (!) 148/72  (!) 152/89 (!) 147/80  Pulse: (!) 106 (!) 105 (!) 109 (!) 111  Resp: 15 (!) 21 (!) 23 20  Temp:      TempSrc:      SpO2: 96% 96% 96% 94%  Weight:      Height:        Isolation Precautions Droplet precaution  Medications Medications  azithromycin (ZITHROMAX) 500 mg in sodium chloride 0.9 % 250 mL IVPB (500 mg Intravenous New Bag/Given 08/30/22 0038)  pantoprazole (PROTONIX) EC tablet 20 mg (has no administration in time range)  atenolol (TENORMIN) tablet 50 mg (has no administration in time range)  cefTRIAXone (ROCEPHIN) 1 g in sodium chloride 0.9 % 100 mL IVPB (has no administration in time range)  azithromycin (ZITHROMAX) 500 mg in sodium chloride 0.9 % 250 mL IVPB (has no administration in time range)  predniSONE (DELTASONE) tablet 40 mg (has no administration in time range)  ipratropium-albuterol (DUONEB) 0.5-2.5 (3) MG/3ML nebulizer solution 3 mL (3 mLs Nebulization Given 08/30/22 0038)   enoxaparin (LOVENOX) injection 60 mg (has no administration in time range)  acetaminophen (TYLENOL) tablet 650 mg (has no administration in time range)    Or  acetaminophen (TYLENOL) suppository 650 mg (has no administration in time range)  polyethylene glycol (MIRALAX / GLYCOLAX) packet 17 g (has no administration in time range)  sodium chloride flush (NS) 0.9 % injection 3 mL (3 mLs Intravenous Given 08/30/22 0039)  sodium chloride 0.9 % bolus 1,000 mL (1,000 mLs Intravenous New Bag/Given 08/29/22 2338)  ipratropium-albuterol (DUONEB) 0.5-2.5 (3) MG/3ML nebulizer solution 3 mL (3 mLs Nebulization Given 08/29/22 2337)  methylPREDNISolone sodium succinate (SOLU-MEDROL) 125 mg/2 mL injection 125 mg (125 mg Intravenous Given 08/29/22 2337)  cefTRIAXone (ROCEPHIN) 1 g in sodium chloride 0.9 % 100 mL IVPB (0 g Intravenous Stopped 08/30/22 0008)  chlorpheniramine-HYDROcodone (TUSSIONEX) 10-8 MG/5ML suspension 5 mL (5 mLs Oral Given 08/29/22 2339)    Mobility walks     Focused Assessments Pulmonary Assessment Handoff:  Lung sounds: Bilateral Breath Sounds: Inspiratory wheezes L Breath Sounds: Inspiratory wheezes R Breath Sounds: Inspiratory wheezes O2 Device: Nasal Cannula O2 Flow Rate (L/min): 2 L/min    R Recommendations: See Admitting Provider Note  Report given to:   Additional Notes:

## 2022-08-30 NOTE — Plan of Care (Signed)
  Problem: Clinical Measurements: Goal: Respiratory complications will improve Outcome: Progressing   Problem: Activity: Goal: Risk for activity intolerance will decrease Outcome: Progressing   Problem: Coping: Goal: Level of anxiety will decrease Outcome: Progressing   Problem: Safety: Goal: Ability to remain free from injury will improve Outcome: Progressing   

## 2022-08-30 NOTE — H&P (Addendum)
History and Physical    Patient: Jeffery Meyer WGN:[id number] DOB: 07/07/1955 DOA: 08/29/2022 DOS: the patient was seen and examined on 08/29/2022 PCP: Gracelyn Nurse, MD  Patient coming from: Home  Chief Complaint:  Chief Complaint  Patient presents with   Shortness of Breath   HPI: Jeffery Meyer is a 67 y.o. male with medical history significant of no chrnoic pulmonary issues. 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  Patient was in his usual state of health till approximately a month ago when he reports a new onset of sensation of a cough that was mostly dry with associated sensation of shortness of breath typically when he would walk outside the house.  There is no report of patient having any fevers leg swelling chest pain palpitations or loss of consciousness.  Patient has had waxing and waning course of the syndrome of cough and shortness of breath over the last month/30 days or so.  Patient has apparently completed 2 courses of steroids as an outpatient.  For the last 72 hours, patient's coughing and shortness of breath has worsened, patient has not been able to sleep for the last 7 days in peace.  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  Patient also reports having sweating for the last 48 hours but no documented fevers or rigors.  Patient came to the ER because of his worsening symptoms as above.  ER course is notable for patient being found to be hypoxic to 89% SpO2 on room air patient was also found to have wheezing on exam.  Patient has received IV steroids bronchodilator therapy as well as IV antibiotics as well as oxygen supplementation  Patient reports feeling better.  However since he is not exerting himself cannot tell if his sensation of shortness  of breath is all gone. Review of Systems: As mentioned in the history of present illness. All other systems reviewed and are negative. Past Medical History:  Diagnosis Date   Back pain    Hypertension    Knee pain    Prostate cancer [facility])    Past Surgical History:  Procedure Laterality Date   PROSTATE SURGERY     Social History:  reports that he has quit smoking. He has never used smokeless tobacco. He reports current alcohol use of about 21.0 standard drinks of alcohol per week. He reports that he does not use drugs.  No Known Allergies  Family History  Problem Relation Age of Onset   Hypertension Mother    Hypertension Father    Hypertension Sister    Hypertension Brother     Prior to Admission medications   Medication Sig Start Date End Date Taking? Authorizing Provider  atenolol (TENORMIN) 50 MG tablet Take 1 tablet (50 mg total) by mouth daily. 10/22/16   Ellyn Hack, MD  atorvastatin (LIPITOR) 10 MG tablet Take 1 tablet (10 mg total) by mouth daily. 10/22/16   Ellyn Hack, MD  meclizine (ANTIVERT) 25 MG tablet Take 1 tablet (25 mg total) by mouth 3 (three) times daily as needed for dizziness. 07/16/17   Minna Antis, MD  meloxicam (MOBIC) 7.5 MG tablet Take 1 tablet (7.5 mg total) by mouth daily. 02/01/16   Velta Addison  A, MD  mometasone (NASONEX) 50 MCG/ACT nasal spray Place 2 sprays into the nose daily. 04/24/16   Ellyn Hack, MD   At this time patient is only sure of having taken atenolol 50 mg daily for several years as well as a Nexium tablet.  Unfortunately patient is not sure of his rest of his home medications.  Specifically patient advises that he takes antidepressant that he cannot name.  Physical Exam: Vitals:   08/29/22 2254 08/29/22 2255 08/29/22 2330 08/30/22 0000  BP: (!) 148/72  (!) 152/89 (!) 147/80  Pulse: (!) 106 (!) 105 (!) 109 (!) 111  Resp: 15 (!) 21 (!) 23 20  Temp:      TempSrc:      SpO2: 96% 96% 96% 94%  Weight:       Height:       General: Overweight/obese gentleman alert awake oriented x 3 no focal no distress on 2 L/min of supplemental oxygen Respiratory exam: Bilateral air entry vesicular: Diffuse minimal expiratory wheezes, some upper airway conducted breath sounds as well. Cardiovascular exam S1-S2 normal tachycardia Abdomen soft nontender Extremities warm without edema. Data Reviewed:  Labs on Admission:  Results for orders placed or performed during the hospital encounter of 08/29/22 (from the past 24 hour(s))  Basic metabolic panel     Status: Abnormal   Collection Time: 08/29/22 10:51 PM  Result Value Ref Range   Sodium 134 (L) 135 - 145 mmol/L   Potassium 3.8 3.5 - 5.1 mmol/L   Chloride 102 98 - 111 mmol/L   CO2 22 22 - 32 mmol/L   Glucose, Bld 127 (H) 70 - 99 mg/dL   BUN 6 (L) 8 - 23 mg/dL   Creatinine, Ser 5.40 0.61 - 1.24 mg/dL   Calcium 8.7 (L) 8.9 - 10.3 mg/dL   GFR, Estimated >98 >11 mL/min   Anion gap 10 5 - 15  CBC     Status: Abnormal   Collection Time: 08/29/22 10:51 PM  Result Value Ref Range   WBC 12.2 (H) 4.0 - 10.5 K/uL   RBC 4.29 4.22 - 5.81 MIL/uL   Hemoglobin 14.6 13.0 - 17.0 g/dL   HCT 91.4 78.2 - 95.6 %   MCV 97.4 80.0 - 100.0 fL   MCH 34.0 26.0 - 34.0 pg   MCHC 34.9 30.0 - 36.0 g/dL   RDW 21.3 08.6 - 57.8 %   Platelets 220 150 - 400 K/uL   nRBC 0.0 0.0 - 0.2 %  Troponin I (High Sensitivity)     Status: None   Collection Time: 08/29/22 10:51 PM  Result Value Ref Range   Troponin I (High Sensitivity) 5 <18 ng/L  Lactic acid, plasma     Status: Abnormal   Collection Time: 08/29/22 11:35 PM  Result Value Ref Range   Lactic Acid, Venous 2.0 (HH) 0.5 - 1.9 mmol/L   Basic Metabolic Panel: Recent Labs  Lab 08/29/22 2251  NA 134*  K 3.8  CL 102  CO2 22  GLUCOSE 127*  BUN 6*  CREATININE 0.89  CALCIUM 8.7*   Liver Function Tests: No results for input(s): "AST", "ALT", "ALKPHOS", "BILITOT", "PROT", "ALBUMIN" in the last 168 hours. No results for  input(s): "LIPASE", "AMYLASE" in the last 168 hours. No results for input(s): "AMMONIA" in the last 168 hours. CBC: Recent Labs  Lab 08/29/22 2251  WBC 12.2*  HGB 14.6  HCT 41.8  MCV 97.4  PLT 220   Cardiac Enzymes: Recent Labs  Lab  08/29/22 2251  TROPONINIHS 5    BNP (last 3 results) No results for input(s): "PROBNP" in the last 8760 hours. CBG: No results for input(s): "GLUCAP" in the last 168 hours.  Radiological Exams on Admission:  DG Chest Port 1 View  Result Date: 08/29/2022 CLINICAL DATA:  Shortness of breath EXAM: PORTABLE CHEST 1 VIEW COMPARISON:  07/16/2017 FINDINGS: Heart and mediastinal contours within normal limits. Increased opacity in the right infrahilar region. No confluent opacity on the left. No effusions or acute bony abnormality. IMPRESSION: Right infrahilar atelectasis or infiltrate. Electronically Signed   By: Charlett Nose M.D.   On: 08/29/2022 23:09    EKG: Independently reviewed. Sinus tachycardia.   Assessment and Plan: * CAP (community acquired pneumonia) Patient describes almost a month-long syndrome of coughing and shortness of breath. Did get steroid rx as outpatient.  that has acutely worsened in the last 2 to 3 days and is associated with leukocytosis and tachycardia concerning for sepsis.  Cultures have been ordered, s/p ceftriaxone and azithromycin azithromycin.  Continue same.  Pending COVID-19 testing at this time. Check influenza panel.  Pending differential on CBC.  Patient was noted to have wheezing on initial presentation, during my exam there is very negligible wheezing, very hard to distinguish from upper airway breath sounds therefore this seems to have improved.  S/p Solu-Medrol in the ER.  I will continue with prednisone 40 daily.  concern here is if patient is having an asthma exacerbation that has previously not been diagnosed.  Patient does not report any new exposures such as new exposure to sick, new household new pets  etc.  A/w hypoxia.   Lactic acidosis is negligible, patient has received a liter of fluid, looking nontoxic.  Likely related to hypoxia.  Will monitor clinically  Depression This is a nonacute diagnosis of the patient.  Patient unable to tell what antidepressant he takes at home.  Wants to figure this out in the morning, we will plan on continuing same    Advance Care Planning:   Code Status: Not on file full code.  Consults: n/a  Family Communication: per pateint.  Severity of Illness: The appropriate patient status for this patient is INPATIENT. Inpatient status is judged to be reasonable and necessary in order to provide the required intensity of service to ensure the patient's safety. The patient's presenting symptoms, physical exam findings, and initial radiographic and laboratory data in the context of their chronic comorbidities is felt to place them at high risk for further clinical deterioration. Furthermore, it is not anticipated that the patient will be medically stable for discharge from the hospital within 2 midnights of admission.   * I certify that at the point of admission it is my clinical judgment that the patient will require inpatient hospital care spanning beyond 2 midnights from the point of admission due to high intensity of service, high risk for further deterioration and high frequency of surveillance required.*  Author: Nolberto Hanlon, MD 08/30/2022 12:13 AM  For on call review [email or web address].

## 2022-08-30 NOTE — Progress Notes (Signed)
PHARMACIST - PHYSICIAN COMMUNICATION  CONCERNING:  Enoxaparin (Lovenox) for DVT Prophylaxis    RECOMMENDATION: Patient was prescribed enoxaprin 40mg  q24 hours for VTE prophylaxis.   Filed Weights   08/29/22 2244  Weight: 117.9 kg (260 lb)    Body mass index is 32.5 kg/m.  Estimated Creatinine Clearance: 111.5 mL/min (by C-G formula based on SCr of 0.89 mg/dL).   Based on [facility] policy patient is candidate for enoxaparin 0.5mg /kg TBW SQ every 24 hours based on BMI being >30.  DESCRIPTION: Pharmacy has adjusted enoxaparin dose per [facility] policy.  Patient is now receiving enoxaparin 0.5 mg/kg every 24 hours   Otelia Sergeant, PharmD, [facility] 08/30/2022 12:31 AM

## 2022-08-31 DIAGNOSIS — J189 Pneumonia, unspecified organism: Secondary | ICD-10-CM | POA: Diagnosis not present

## 2022-08-31 MED ORDER — GUAIFENESIN-DM 100-10 MG/5ML PO SYRP
5.0000 mL | ORAL_SOLUTION | ORAL | Status: DC | PRN
  Administered 2022-08-31 (×3): 5 mL via ORAL
  Filled 2022-08-31 (×3): qty 10

## 2022-08-31 MED ORDER — AZITHROMYCIN 500 MG PO TABS
500.0000 mg | ORAL_TABLET | Freq: Every day | ORAL | Status: AC
  Administered 2022-08-31: 500 mg via ORAL
  Filled 2022-08-31: qty 1

## 2022-08-31 MED ORDER — IPRATROPIUM-ALBUTEROL 0.5-2.5 (3) MG/3ML IN SOLN
3.0000 mL | Freq: Two times a day (BID) | RESPIRATORY_TRACT | Status: DC
  Administered 2022-08-31 – 2022-09-01 (×2): 3 mL via RESPIRATORY_TRACT
  Filled 2022-08-31 (×2): qty 3

## 2022-08-31 NOTE — Progress Notes (Signed)
PHARMACY - PHYSICIAN COMMUNICATION CRITICAL VALUE ALERT - BLOOD CULTURE IDENTIFICATION (BCID)  Results for orders placed or performed during the hospital encounter of 08/29/22  Culture, blood (routine x 2)     Status: None (Preliminary result)   Collection Time: 08/29/22 11:35 PM   Specimen: BLOOD  Result Value Ref Range Status   Specimen Description BLOOD  RAC  Final   Special Requests   Final    BOTTLES DRAWN AEROBIC AND ANAEROBIC Blood Culture adequate volume   Culture   Final    NO GROWTH 2 DAYS Performed at [facility], [address] [place], [place] [address]    Report Status PENDING  Incomplete  Culture, blood (routine x 2)     Status: None (Preliminary result)   Collection Time: 08/29/22 11:35 PM   Specimen: BLOOD  Result Value Ref Range Status   Specimen Description BLOOD  RH  Final   Special Requests   Final    BOTTLES DRAWN AEROBIC AND ANAEROBIC Blood Culture adequate volume   Culture   Final    NO GROWTH 2 DAYS Performed at [facility], [address], [place], [place] [address]    Report Status PENDING  Incomplete  SARS Coronavirus 2 by RT PCR (hospital order, performed in [facility] hospital lab) *cepheid single result test* Anterior Nasal Swab     Status: None   Collection Time: 08/29/22 11:35 PM   Specimen: Anterior Nasal Swab  Result Value Ref Range Status   SARS Coronavirus 2 by RT PCR NEGATIVE NEGATIVE Final    Comment: (NOTE) SARS-CoV-2 target nucleic acids are NOT DETECTED.  The SARS-CoV-2 RNA is generally detectable in upper and lower respiratory specimens during the acute phase of infection. The lowest concentration of SARS-CoV-2 viral copies this assay can detect is 250 copies / mL. A negative result does not preclude SARS-CoV-2 infection and should not be used as the sole basis for treatment or other patient management decisions.  A negative result may occur with improper specimen collection / handling, submission of specimen  other than nasopharyngeal swab, presence of viral mutation(s) within the areas targeted by this assay, and inadequate number of viral copies (<250 copies / mL). A negative result must be combined with clinical observations, patient history, and epidemiological information.  Fact Sheet for Patients:   RoadLapTop.co.za  Fact Sheet for Healthcare Providers: http://kim-miller.com/  This test is not yet approved or  cleared by the [place] FDA and has been authorized for detection and/or diagnosis of SARS-CoV-2 by FDA under an Emergency Use Authorization (EUA).  This EUA will remain in effect (meaning this test can be used) for the duration of the COVID-19 declaration under Section 564(b)(1) of the Act, 21 U.S.C. section 360bbb-3(b)(1), unless the authorization is terminated or revoked sooner.  Performed at [facility], [address] [place], [place] [address]   Respiratory (~20 pathogens) panel by PCR     Status: None   Collection Time: 08/30/22 12:39 AM   Specimen: Nasopharyngeal Swab; Respiratory  Result Value Ref Range Status   Adenovirus NOT DETECTED NOT DETECTED Final   Coronavirus 229E NOT DETECTED NOT DETECTED Final    Comment: (NOTE) The Coronavirus on the Respiratory Panel, DOES NOT test for the novel  Coronavirus (2019 nCoV)    Coronavirus HKU1 NOT DETECTED NOT DETECTED Final   Coronavirus NL63 NOT DETECTED NOT DETECTED Final   Coronavirus OC43 NOT DETECTED NOT DETECTED Final   Metapneumovirus NOT DETECTED NOT DETECTED Final  Rhinovirus / Enterovirus NOT DETECTED NOT DETECTED Final   Influenza A NOT DETECTED NOT DETECTED Final   Influenza B NOT DETECTED NOT DETECTED Final   Parainfluenza Virus 1 NOT DETECTED NOT DETECTED Final   Parainfluenza Virus 2 NOT DETECTED NOT DETECTED Final   Parainfluenza Virus 3 NOT DETECTED NOT DETECTED Final   Parainfluenza Virus 4 NOT DETECTED NOT DETECTED Final   Respiratory  Syncytial Virus NOT DETECTED NOT DETECTED Final   Bordetella pertussis NOT DETECTED NOT DETECTED Final   Bordetella Parapertussis NOT DETECTED NOT DETECTED Final   Chlamydophila pneumoniae NOT DETECTED NOT DETECTED Final   Mycoplasma pneumoniae NOT DETECTED NOT DETECTED Final    Comment: Performed at [facility], [address], [place], [place] [address]    BCID Results: 1 of 4 bottles w/ GPR, sending sample to [facility]  Name of provider contacted: N/A   Changes to prescribed antibiotics required: No recommendation at this time.  Will wait for additional BCID results prior to notifying provider on call.  Otelia Sergeant, PharmD, [facility] 08/31/2022 5:54 AM

## 2022-08-31 NOTE — Plan of Care (Signed)

## 2022-08-31 NOTE — Progress Notes (Signed)
PROGRESS NOTE    KATSUMI PAULOVICH  WGN:[id number] DOB: [birth year] DOA: 08/29/2022 PCP: Gracelyn Nurse, MD    Brief Narrative:  67 y.o. male with medical history significant of no chrnoic pulmonary issues. 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  Patient was in his usual state of health till approximately a month ago when he reports a new onset of sensation of a cough that was mostly dry with associated sensation of shortness of breath typically when he would walk outside the house.  There is no report of patient having any fevers leg swelling chest pain palpitations or loss of consciousness.  Patient has had waxing and waning course of the syndrome of cough and shortness of breath over the last month/30 days or so.  Patient has apparently completed 2 courses of steroids as an outpatient.  For the last 72 hours, patient's coughing and shortness of breath has worsened, patient has not been able to sleep for the last 7 days in peace.  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  Patient also reports having sweating for the last 48 hours but no documented fevers or rigors.   7/27: Respiratory status improved.  Respiratory viral panel negative.  Chest CT without acute infiltrate.  Echocardiogram normal.  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   Assessment & Plan:   Principal Problem:   CAP (community acquired pneumonia) Active Problems:   Depression   Community acquired pneumonia  Right lower lobe infiltrate, ruled out Unclear etiology.  Unable to exclude community-acquired pneumonia. Negative procalcitonin, no fever, no white count speak away from this CT without infiltrate COVID and RVP negative Plan: Discontinue  Rocephin Continue azithromycin x 3 days for anti-inflammatory effect Follow-up strep pneumo and Legionella Continue Rocephin and azithromycin for now Monitor vitals and fever curve Check strep pneumo, Legionella Follow-up Fungitell  Progressive shortness of breath Acute hypoxic respiratory failure Suspected acute decompensated COPD This has been occurring over the past several months.  Patient was initially evaluated in January treated with a short course of steroids and antibiotics.  Stated symptoms improved initially but once he was done with the steroid course they recurred.  Patient has had several rounds of steroids and antibiotics without good long-term results -Patient does not have a diagnosis of COPD but stated his wife is a long-term smoker who smokes in the house.  I suspect COPD is primary driver for patient's symptoms. Plan: Continue IV steroids Continue nebulizers Wean oxygen as tolerated Anticipate discharge 7/28  Essential hypertension PTA atenolol  Depression PTA Celexa  Obesity BMI 32.5.  This complicates overall care and prognosis  DVT prophylaxis: SQ Lovenox Code Status: Full Family Communication: None Disposition Plan: Status is: Inpatient Remains inpatient appropriate because: Decompensated COPD on IV steroid   Level of care: Med-Surg  Consultants:  None Procedures:  None  Antimicrobials: Azithromycin   Subjective: Seen and went.  Sitting up in chair.  Shortness of breath improving.  Objective: Vitals:   08/30/22 2340 08/31/22 0102 08/31/22 0700 08/31/22 0921  BP: 128/61   139/85  Pulse: 88   90  Resp: 20   18  Temp: 97.9 F (36.6 C)   97.8 F (36.6 C)  TempSrc:  SpO2: 95% 95% 100% 94%  Weight:      Height:        Intake/Output Summary (Last 24 hours) at 08/31/2022 1118 Last data filed at 08/31/2022 0319 Gross per 24 hour  Intake 1190.81 ml  Output --  Net 1190.81 ml   Filed Weights   08/29/22 2244  Weight: 117.9 kg     Examination:  General exam: No acute distress Respiratory system: Improved air movement bilaterally.  Normal work of breathing.  1 L Cardiovascular system: S1-S2, RRR, no murmurs, no pedal edema Gastrointestinal system: Obese, soft, NT/ND, normal bowel sounds Central nervous system: Alert and oriented. No focal neurological deficits. Extremities: Symmetric 5 x 5 power. Skin: No rashes, lesions or ulcers Psychiatry: Judgement and insight appear normal. Mood & affect appropriate.     Data Reviewed: I have personally reviewed following labs and imaging studies  CBC: Recent Labs  Lab 08/29/22 2251 08/30/22 0039 08/30/22 0319  WBC 12.2* 7.9 7.3  NEUTROABS  --  5.3  --   HGB 14.6 13.3 13.9  HCT 41.8 38.8* 39.9  MCV 97.4 97.7 97.3  PLT 220 195 194   Basic Metabolic Panel: Recent Labs  Lab 08/29/22 2251 08/30/22 0039 08/30/22 0319  NA 134*  --  140  K 3.8  --  4.1  CL 102  --  108  CO2 22  --  25  GLUCOSE 127*  --  133*  BUN 6*  --  6*  CREATININE 0.89 0.78 0.79  CALCIUM 8.7*  --  8.8*   GFR: Estimated Creatinine Clearance: 124.1 mL/min (by C-G formula based on SCr of 0.79 mg/dL). Liver Function Tests: No results for input(s): "AST", "ALT", "ALKPHOS", "BILITOT", "PROT", "ALBUMIN" in the last 168 hours. No results for input(s): "LIPASE", "AMYLASE" in the last 168 hours. No results for input(s): "AMMONIA" in the last 168 hours. Coagulation Profile: Recent Labs  Lab 08/30/22 0319  INR 1.1   Cardiac Enzymes: No results for input(s): "CKTOTAL", "CKMB", "CKMBINDEX", "TROPONINI" in the last 168 hours. BNP (last 3 results) No results for input(s): "PROBNP" in the last 8760 hours. HbA1C: No results for input(s): "HGBA1C" in the last 72 hours. CBG: No results for input(s): "GLUCAP" in the last 168 hours. Lipid Profile: No results for input(s): "CHOL", "HDL", "LDLCALC", "TRIG", "CHOLHDL", "LDLDIRECT" in the last 72 hours. Thyroid Function Tests: No results for  input(s): "TSH", "T4TOTAL", "FREET4", "T3FREE", "THYROIDAB" in the last 72 hours. Anemia Panel: No results for input(s): "VITAMINB12", "FOLATE", "FERRITIN", "TIBC", "IRON", "RETICCTPCT" in the last 72 hours. Sepsis Labs: Recent Labs  Lab 08/29/22 2335 08/30/22 0039 08/30/22 0319  PROCALCITON  --   --  <0.10  LATICACIDVEN 2.0* 1.8  --     Recent Results (from the past 240 hour(s))  Culture, blood (routine x 2)     Status: None (Preliminary result)   Collection Time: 08/29/22 11:35 PM   Specimen: BLOOD  Result Value Ref Range Status   Specimen Description BLOOD  RAC  Final   Special Requests   Final    BOTTLES DRAWN AEROBIC AND ANAEROBIC Blood Culture adequate volume   Culture  Setup Time   Final    GRAM POSITIVE RODS AEROBIC BOTTLE ONLY CRITICAL RESULT CALLED TO, READ BACK BY AND VERIFIED WITH: NATHAN BELUE AT 0554 08/31/22.PMF Performed at [facility], [address], [place], [place] [address]    Culture GRAM POSITIVE RODS  Final   Report Status PENDING  Incomplete  Culture, blood (routine x  2)     Status: None (Preliminary result)   Collection Time: 08/29/22 11:35 PM   Specimen: BLOOD  Result Value Ref Range Status   Specimen Description BLOOD  RH  Final   Special Requests   Final    BOTTLES DRAWN AEROBIC AND ANAEROBIC Blood Culture adequate volume   Culture   Final    NO GROWTH 2 DAYS Performed at [facility], [address] [place], [place] [address]    Report Status PENDING  Incomplete  SARS Coronavirus 2 by RT PCR (hospital order, performed in [facility] hospital lab) *cepheid single result test* Anterior Nasal Swab     Status: None   Collection Time: 08/29/22 11:35 PM   Specimen: Anterior Nasal Swab  Result Value Ref Range Status   SARS Coronavirus 2 by RT PCR NEGATIVE NEGATIVE Final    Comment: (NOTE) SARS-CoV-2 target nucleic acids are NOT DETECTED.  The SARS-CoV-2 RNA is generally detectable in upper and lower respiratory specimens  during the acute phase of infection. The lowest concentration of SARS-CoV-2 viral copies this assay can detect is 250 copies / mL. A negative result does not preclude SARS-CoV-2 infection and should not be used as the sole basis for treatment or other patient management decisions.  A negative result may occur with improper specimen collection / handling, submission of specimen other than nasopharyngeal swab, presence of viral mutation(s) within the areas targeted by this assay, and inadequate number of viral copies (<250 copies / mL). A negative result must be combined with clinical observations, patient history, and epidemiological information.  Fact Sheet for Patients:   RoadLapTop.co.za  Fact Sheet for Healthcare Providers: http://kim-miller.com/  This test is not yet approved or  cleared by the [place] FDA and has been authorized for detection and/or diagnosis of SARS-CoV-2 by FDA under an Emergency Use Authorization (EUA).  This EUA will remain in effect (meaning this test can be used) for the duration of the COVID-19 declaration under Section 564(b)(1) of the Act, 21 U.S.C. section 360bbb-3(b)(1), unless the authorization is terminated or revoked sooner.  Performed at [facility], [address] [place], [place] [address]   Respiratory (~20 pathogens) panel by PCR     Status: None   Collection Time: 08/30/22 12:39 AM   Specimen: Nasopharyngeal Swab; Respiratory  Result Value Ref Range Status   Adenovirus NOT DETECTED NOT DETECTED Final   Coronavirus 229E NOT DETECTED NOT DETECTED Final    Comment: (NOTE) The Coronavirus on the Respiratory Panel, DOES NOT test for the novel  Coronavirus (2019 nCoV)    Coronavirus HKU1 NOT DETECTED NOT DETECTED Final   Coronavirus NL63 NOT DETECTED NOT DETECTED Final   Coronavirus OC43 NOT DETECTED NOT DETECTED Final   Metapneumovirus NOT DETECTED NOT DETECTED Final    Rhinovirus / Enterovirus NOT DETECTED NOT DETECTED Final   Influenza A NOT DETECTED NOT DETECTED Final   Influenza B NOT DETECTED NOT DETECTED Final   Parainfluenza Virus 1 NOT DETECTED NOT DETECTED Final   Parainfluenza Virus 2 NOT DETECTED NOT DETECTED Final   Parainfluenza Virus 3 NOT DETECTED NOT DETECTED Final   Parainfluenza Virus 4 NOT DETECTED NOT DETECTED Final   Respiratory Syncytial Virus NOT DETECTED NOT DETECTED Final   Bordetella pertussis NOT DETECTED NOT DETECTED Final   Bordetella Parapertussis NOT DETECTED NOT DETECTED Final   Chlamydophila pneumoniae NOT DETECTED NOT DETECTED Final   Mycoplasma pneumoniae NOT DETECTED NOT DETECTED Final    Comment: Performed at [facility]  Hospital Lab, [address] [place], [place] [address]         Radiology Studies: ECHOCARDIOGRAM COMPLETE  Result Date: 08/30/2022    ECHOCARDIOGRAM REPORT   Patient Name:   Jeffery Meyer Date of Exam: 08/30/2022 Medical Rec #:  [id number]    Height:       75.0 in Accession #:    [id number]   Weight:       260.0 lb Date of Birth:  [birth year]    BSA:          2.453 m Patient Age:    67 years     BP:           150/82 mmHg Patient Gender: M            HR:           99 bpm. Exam Location:  [facility] Procedure: 2D Echo, Cardiac Doppler and Color Doppler Indications:     CHF  History:         Patient has no prior history of Echocardiogram examinations.                  CHF; Risk Factors:Hypertension and Dyslipidemia.  Sonographer:     Mikki Harbor Referring Phys:  [id number] Tresa Moore Diagnosing Phys: Julien Nordmann MD  Sonographer Comments: Technically difficult study due to poor echo windows and patient is obese. IMPRESSIONS  1. Left ventricular ejection fraction, by estimation, is 60 to 65%. The left ventricle has normal function. The left ventricle has no regional wall motion abnormalities. There is mild left ventricular hypertrophy. Left ventricular diastolic parameters are consistent with Grade I diastolic  dysfunction (impaired relaxation).  2. Right ventricular systolic function is normal. The right ventricular size is normal. There is normal pulmonary artery systolic pressure. The estimated right ventricular systolic pressure is 22.7 mmHg.  3. The mitral valve is normal in structure. No evidence of mitral valve regurgitation. No evidence of mitral stenosis.  4. The aortic valve is tricuspid. Aortic valve regurgitation is not visualized. No aortic stenosis is present.  5. There is borderline dilatation of the aortic root, measuring 38 mm.  6. The inferior vena cava is normal in size with greater than 50% respiratory variability, suggesting right atrial pressure of 3 mmHg. FINDINGS  Left Ventricle: Left ventricular ejection fraction, by estimation, is 60 to 65%. The left ventricle has normal function. The left ventricle has no regional wall motion abnormalities. The left ventricular internal cavity size was normal in size. There is  mild left ventricular hypertrophy. Left ventricular diastolic parameters are consistent with Grade I diastolic dysfunction (impaired relaxation). Right Ventricle: The right ventricular size is normal. No increase in right ventricular wall thickness. Right ventricular systolic function is normal. There is normal pulmonary artery systolic pressure. The tricuspid regurgitant velocity is 2.22 m/s, and  with an assumed right atrial pressure of 3 mmHg, the estimated right ventricular systolic pressure is 22.7 mmHg. Left Atrium: Left atrial size was normal in size. Right Atrium: Right atrial size was normal in size. Pericardium: There is no evidence of pericardial effusion. Mitral Valve: The mitral valve is normal in structure. There is mild calcification of the mitral valve leaflet(s). Mild mitral annular calcification. No evidence of mitral valve regurgitation. No evidence of mitral valve stenosis. MV peak gradient, 7.0 mmHg. The mean mitral valve gradient is 3.0 mmHg. Tricuspid Valve: The  tricuspid valve is normal in structure. Tricuspid valve regurgitation is mild . No evidence of tricuspid  stenosis. Aortic Valve: The aortic valve is tricuspid. Aortic valve regurgitation is not visualized. No aortic stenosis is present. Aortic valve mean gradient measures 7.0 mmHg. Aortic valve peak gradient measures 11.0 mmHg. Aortic valve area, by VTI measures 3.29  cm. Pulmonic Valve: The pulmonic valve was normal in structure. Pulmonic valve regurgitation is not visualized. No evidence of pulmonic stenosis. Aorta: The aortic root is normal in size and structure. There is borderline dilatation of the aortic root, measuring 38 mm. Venous: The inferior vena cava is normal in size with greater than 50% respiratory variability, suggesting right atrial pressure of 3 mmHg. IAS/Shunts: No atrial level shunt detected by color flow Doppler.  LEFT VENTRICLE PLAX 2D LVIDd:         5.60 cm   Diastology LVIDs:         3.80 cm   LV e' medial:    6.20 cm/s LV PW:         1.20 cm   LV E/e' medial:  12.5 LV IVS:        1.20 cm   LV e' lateral:   9.25 cm/s LVOT diam:     2.20 cm   LV E/e' lateral: 8.4 LV SV:         117 LV SV Index:   48 LVOT Area:     3.80 cm  RIGHT VENTRICLE RV Basal diam:  4.00 cm RV Mid diam:    3.30 cm RV S prime:     22.20 cm/s TAPSE (M-mode): 2.8 cm LEFT ATRIUM             Index        RIGHT ATRIUM           Index LA diam:        4.80 cm 1.96 cm/m   RA Area:     19.00 cm LA Vol (A2C):   75.0 ml 30.57 ml/m  RA Volume:   55.90 ml  22.79 ml/m LA Vol (A4C):   53.8 ml 21.93 ml/m LA Biplane Vol: 63.4 ml 25.84 ml/m  AORTIC VALVE                     PULMONIC VALVE AV Area (Vmax):    2.93 cm      PV Vmax:       1.24 m/s AV Area (Vmean):   2.72 cm      PV Peak grad:  6.2 mmHg AV Area (VTI):     3.29 cm AV Vmax:           166.00 cm/s AV Vmean:          119.000 cm/s AV VTI:            0.356 m AV Peak Grad:      11.0 mmHg AV Mean Grad:      7.0 mmHg LVOT Vmax:         128.00 cm/s LVOT Vmean:        85.000  cm/s LVOT VTI:          0.308 m LVOT/AV VTI ratio: 0.87  AORTA Ao Root diam: 3.80 cm Ao Asc diam:  3.50 cm MITRAL VALVE                TRICUSPID VALVE MV Area (PHT): 4.99 cm     TR Peak grad:   19.7 mmHg MV Area VTI:   4.35 cm     TR Vmax:  222.00 cm/s MV Peak grad:  7.0 mmHg MV Mean grad:  3.0 mmHg     SHUNTS MV Vmax:       1.32 m/s     Systemic VTI:  0.31 m MV Vmean:      86.2 cm/s    Systemic Diam: 2.20 cm MV Decel Time: 152 msec MV E velocity: 77.60 cm/s MV A velocity: 120.00 cm/s MV E/A ratio:  0.65 Julien Nordmann MD Electronically signed by Julien Nordmann MD Signature Date/Time: 08/30/2022/2:35:16 PM    Final    CT Angio Chest Pulmonary Embolism (PE) W or WO Contrast  Result Date: 08/30/2022 CLINICAL DATA:  Pulmonary embolism suspected, high probability. Shortness breath and cough. EXAM: CT ANGIOGRAPHY CHEST WITH CONTRAST TECHNIQUE: Multidetector CT imaging of the chest was performed using the standard protocol during bolus administration of intravenous contrast. Multiplanar CT image reconstructions and MIPs were obtained to evaluate the vascular anatomy. RADIATION DOSE REDUCTION: This exam was performed according to the departmental dose-optimization program which includes automated exposure control, adjustment of the mA and/or kV according to patient size and/or use of iterative reconstruction technique. CONTRAST:  75mL OMNIPAQUE IOHEXOL 350 MG/ML SOLN COMPARISON:  One-view chest x-ray 08/29/2022 FINDINGS: Cardiovascular: The heart size is normal. Coronary artery calcifications are present. No significant pericardial effusion is present. Minimal atherosclerotic calcifications are present in the aorta and branch vessels. No aneurysm is present. Great vessel origins are within normal limits. Pulmonary artery opacification is satisfactory. No focal filling defects are present to suggest pulmonary embolus. Pulmonary artery size normal. Mediastinum/Nodes: No enlarged mediastinal, hilar, or axillary  lymph nodes. Thyroid gland, trachea, and esophagus demonstrate no significant findings. Lungs/Pleura: Lungs are clear. No pleural effusion or pneumothorax. Upper Abdomen: The visualized upper abdomen unremarkable. Musculoskeletal: Vertebral body heights and alignment are normal. Fused anterior osteophytes span across multiple levels. No focal osseous lesions present. Ribs and sternum are within normal limits. Review of the MIP images confirms the above findings. IMPRESSION: 1. No pulmonary embolus. 2. Coronary artery disease. 3. No acute or focal lesion to explain the patient's symptoms. Electronically Signed   By: Marin Roberts M.D.   On: 08/30/2022 10:34   DG Chest Port 1 View  Result Date: 08/29/2022 CLINICAL DATA:  Shortness of breath EXAM: PORTABLE CHEST 1 VIEW COMPARISON:  07/16/2017 FINDINGS: Heart and mediastinal contours within normal limits. Increased opacity in the right infrahilar region. No confluent opacity on the left. No effusions or acute bony abnormality. IMPRESSION: Right infrahilar atelectasis or infiltrate. Electronically Signed   By: Charlett Nose M.D.   On: 08/29/2022 23:09        Scheduled Meds:  arformoterol  15 mcg Nebulization BID   atenolol  50 mg Oral Daily   budesonide (PULMICORT) nebulizer solution  0.25 mg Nebulization BID   citalopram  40 mg Oral Daily   enoxaparin (LOVENOX) injection  60 mg Subcutaneous Q24H   ipratropium-albuterol  3 mL Nebulization Q6H   methylPREDNISolone (SOLU-MEDROL) injection  80 mg Intravenous Daily   pantoprazole  20 mg Oral Daily   sodium chloride flush  3 mL Intravenous Q12H   Continuous Infusions:  azithromycin Stopped (08/31/22 0140)     LOS: 1 day     Tresa Moore, MD [facility]   If 7PM-7AM, please contact night-coverage  08/31/2022, 11:18 AM

## 2022-08-31 NOTE — Progress Notes (Signed)
PHARMACIST - PHYSICIAN COMMUNICATION  CONCERNING: Antibiotic IV to Oral Route Change Policy  RECOMMENDATION: This patient is receiving azithromycin by the intravenous route.  Based on criteria approved by the Pharmacy and Therapeutics Committee, the antibiotic(s) is/are being converted to the equivalent oral dose form(s).  DESCRIPTION: These criteria include: Patient being treated for a respiratory tract infection, urinary tract infection, cellulitis or clostridium difficile associated diarrhea if on metronidazole The patient is not neutropenic and does not exhibit a GI malabsorption state The patient is eating (either orally or via tube) and/or has been taking other orally administered medications for a least 24 hours The patient is improving clinically and has a Tmax < 100.5  If you have questions about this conversion, please contact the Pharmacy Department   Tressie Ellis 08/31/22

## 2022-08-31 NOTE — TOC CM/SW Note (Signed)
Transition of Care [facility]) - Inpatient Brief Assessment   Patient Details  Name: Jeffery Meyer MRN: [id number] Date of Birth: September 29, 1955  Transition of Care [facility]) CM/SW Contact:    Kemper Durie, RN Phone Number: 08/31/2022, 2:45 PM   Clinical Narrative:  No TOC needs identified currently.   Transition of Care Asessment: Insurance and Status: Insurance coverage has been reviewed Patient has primary care physician: Yes Home environment has been reviewed: Lives with wife,[address] [place] [id number] Prior level of function:: Independent Prior/Current Home Services: No current home services Social Determinants of Health Reivew: SDOH reviewed no interventions necessary Readmission risk has been reviewed: Yes Transition of care needs: no transition of care needs at this time

## 2022-09-01 DIAGNOSIS — J189 Pneumonia, unspecified organism: Secondary | ICD-10-CM | POA: Diagnosis not present

## 2022-09-01 MED ORDER — PREDNISONE 20 MG PO TABS
40.0000 mg | ORAL_TABLET | Freq: Every day | ORAL | Status: DC
  Administered 2022-09-01: 40 mg via ORAL
  Filled 2022-09-01: qty 2

## 2022-09-01 MED ORDER — ALBUTEROL SULFATE HFA 108 (90 BASE) MCG/ACT IN AERS: 2.0000 | INHALATION_SPRAY | Freq: Four times a day (QID) | RESPIRATORY_TRACT | 2 refills | Status: DC | PRN

## 2022-09-01 MED ORDER — BENZONATATE 100 MG PO CAPS: 100.0000 mg | ORAL_CAPSULE | Freq: Three times a day (TID) | ORAL | 0 refills | Status: DC | PRN

## 2022-09-01 MED ORDER — PREDNISONE 20 MG PO TABS: 40.0000 mg | ORAL_TABLET | Freq: Every day | ORAL | 0 refills | Status: AC

## 2022-09-01 NOTE — Progress Notes (Signed)
DISCHARGE NOTE:   Pt discharged with instructions and education given. No further questions or concerns at this time. Pt wheeled down to medical mall entrance. Transportation provided via family friend.

## 2022-09-01 NOTE — Plan of Care (Signed)

## 2022-09-01 NOTE — Plan of Care (Signed)

## 2022-09-01 NOTE — Discharge Summary (Signed)
Physician Discharge Summary  Jeffery Meyer NUU:[id number] DOB: 03-12-1955 DOA: 08/29/2022  PCP: Gracelyn Nurse, MD  Admit date: 08/29/2022 Discharge date: 09/01/2022  Admitted From: Home Disposition:  Home  Recommendations for Outpatient Follow-up:  Follow up with PCP in 1-2 weeks Follow-up with pulmonology Wednesday 7/31  Home Health:No Equipment/Devices:None   Discharge Condition:Stable  CODE STATUS:FULL  Diet recommendation: Regular  Brief/Interim Summary:  67 y.o. male with medical history significant of no chrnoic pulmonary issues. 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  Patient was in his usual state of health till approximately a month ago when he reports a new onset of sensation of a cough that was mostly dry with associated sensation of shortness of breath typically when he would walk outside the house.  There is no report of patient having any fevers leg swelling chest pain palpitations or loss of consciousness.  Patient has had waxing and waning course of the syndrome of cough and shortness of breath over the last month/30 days or so.  Patient has apparently completed 2 courses of steroids as an outpatient.  For the last 72 hours, patient's coughing and shortness of breath has worsened, patient has not been able to sleep for the last 7 days in peace.  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  Patient also reports having sweating for the last 48 hours but no documented fevers or rigors.    7/27: Respiratory status improved.  Respiratory viral panel negative.  Chest CT without acute infiltrate.  Echocardiogram normal.  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  7/28: Respiratory status at baseline.  Air  movement improved.  Patient weaned off supplemental oxygen on room air.  Stable for discharge at this time.  Will recommend additional 4 days of p.o. prednisone 40 mg daily for 5-day course.  Recommend albuterol MDI.  Recommend Tessalon Perles as needed.  Patient has a scheduled appointment with pulmonology for establishment of care on 7/31.  Can consider addition of maintenance inhalers at that time.  Will likely need spirometry testing in the office.    Discharge Diagnoses:  Principal Problem:   CAP (community acquired pneumonia) Active Problems:   Depression   Community acquired pneumonia  Right lower lobe infiltrate, ruled out Community-acquired pneumonia, ruled out Unclear etiology.  Unable to exclude community-acquired pneumonia. Negative procalcitonin, no fever, no white count speak away from this CT without infiltrate COVID and RVP negative Plan: Stable for discharge.  No antibiotics indicated at this time.  Strep pneumo and Legionella pending.  Fungitell pending.  Can follow-up as outpatient.  Progressive shortness of breath Acute hypoxic respiratory failure Suspected acute decompensated COPD This has been occurring over the past several months.  Patient was initially evaluated in January treated with a short course of steroids and antibiotics.  Stated symptoms improved initially but once he was done with the steroid course they recurred.  Patient has had several rounds of steroids and antibiotics without good long-term results -Patient does not have a diagnosis of COPD but stated his wife is a long-term smoker who smokes in the house.  I suspect COPD is primary driver for patient's symptoms. Plan: Patient has been weaned to room air.  Discontinue IV steroids.  5-day course of prednisone 40 mg daily.  Albuterol MDI as needed.  Stable for discharge.  Establish care with pulmonology on 7/31.  Can discuss addition of maintenance inhalers after spirometry testing.   Essential  hypertension PTA atenolol   Depression PTA Celexa   Obesity BMI 32.5.  This complicates overall care and prognosis  Discharge Instructions  Discharge Instructions     Diet - low sodium heart healthy   Complete by: As directed    Increase activity slowly   Complete by: As directed       Allergies as of 09/01/2022   No Known Allergies      Medication List     STOP taking these medications    meclizine 25 MG tablet Commonly known as: ANTIVERT   meloxicam 7.5 MG tablet Commonly known as: MOBIC       TAKE these medications    albuterol 108 (90 Base) MCG/ACT inhaler Commonly known as: VENTOLIN HFA Inhale 2 puffs into the lungs every 6 (six) hours as needed for wheezing or shortness of breath.   atenolol 50 MG tablet Commonly known as: TENORMIN Take 1 tablet (50 mg total) by mouth daily.   atorvastatin 10 MG tablet Commonly known as: LIPITOR Take 1 tablet (10 mg total) by mouth daily.   benzonatate 100 MG capsule Commonly known as: Tessalon Perles Take 1 capsule (100 mg total) by mouth 3 (three) times daily as needed for cough.   citalopram 40 MG tablet Commonly known as: CELEXA Take 40 mg by mouth daily.   mometasone 50 MCG/ACT nasal spray Commonly known as: Nasonex Place 2 sprays into the nose daily.   predniSONE 20 MG tablet Commonly known as: DELTASONE Take 2 tablets (40 mg total) by mouth daily for 4 days. Start taking on: September 02, 2022        No Known Allergies  Consultations: None   Procedures/Studies: ECHOCARDIOGRAM COMPLETE  Result Date: 08/30/2022    ECHOCARDIOGRAM REPORT   Patient Name:   Jeffery Meyer Date of Exam: 08/30/2022 Medical Rec #:  [id number]    Height:       75.0 in Accession #:    [id number]   Weight:       260.0 lb Date of Birth:  1955/09/07    BSA:          2.453 m Patient Age:    67 years     BP:           150/82 mmHg Patient Gender: M            HR:           99 bpm. Exam Location:  [facility] Procedure: 2D Echo, Cardiac  Doppler and Color Doppler Indications:     CHF  History:         Patient has no prior history of Echocardiogram examinations.                  CHF; Risk Factors:Hypertension and Dyslipidemia.  Sonographer:     Mikki Harbor Referring Phys:  [id number] Tresa Moore Diagnosing Phys: Julien Nordmann MD  Sonographer Comments: Technically difficult study due to poor echo windows and patient is obese. IMPRESSIONS  1. Left ventricular ejection fraction, by estimation, is 60 to 65%. The left ventricle has normal function. The left ventricle has no regional wall motion abnormalities. There is mild left ventricular hypertrophy. Left ventricular diastolic parameters are consistent with Grade I diastolic dysfunction (impaired relaxation).  2. Right ventricular  systolic function is normal. The right ventricular size is normal. There is normal pulmonary artery systolic pressure. The estimated right ventricular systolic pressure is 22.7 mmHg.  3. The mitral valve is normal in structure. No evidence of mitral valve regurgitation. No evidence of mitral stenosis.  4. The aortic valve is tricuspid. Aortic valve regurgitation is not visualized. No aortic stenosis is present.  5. There is borderline dilatation of the aortic root, measuring 38 mm.  6. The inferior vena cava is normal in size with greater than 50% respiratory variability, suggesting right atrial pressure of 3 mmHg. FINDINGS  Left Ventricle: Left ventricular ejection fraction, by estimation, is 60 to 65%. The left ventricle has normal function. The left ventricle has no regional wall motion abnormalities. The left ventricular internal cavity size was normal in size. There is  mild left ventricular hypertrophy. Left ventricular diastolic parameters are consistent with Grade I diastolic dysfunction (impaired relaxation). Right Ventricle: The right ventricular size is normal. No increase in right ventricular wall thickness. Right ventricular systolic function is normal.  There is normal pulmonary artery systolic pressure. The tricuspid regurgitant velocity is 2.22 m/s, and  with an assumed right atrial pressure of 3 mmHg, the estimated right ventricular systolic pressure is 22.7 mmHg. Left Atrium: Left atrial size was normal in size. Right Atrium: Right atrial size was normal in size. Pericardium: There is no evidence of pericardial effusion. Mitral Valve: The mitral valve is normal in structure. There is mild calcification of the mitral valve leaflet(s). Mild mitral annular calcification. No evidence of mitral valve regurgitation. No evidence of mitral valve stenosis. MV peak gradient, 7.0 mmHg. The mean mitral valve gradient is 3.0 mmHg. Tricuspid Valve: The tricuspid valve is normal in structure. Tricuspid valve regurgitation is mild . No evidence of tricuspid stenosis. Aortic Valve: The aortic valve is tricuspid. Aortic valve regurgitation is not visualized. No aortic stenosis is present. Aortic valve mean gradient measures 7.0 mmHg. Aortic valve peak gradient measures 11.0 mmHg. Aortic valve area, by VTI measures 3.29  cm. Pulmonic Valve: The pulmonic valve was normal in structure. Pulmonic valve regurgitation is not visualized. No evidence of pulmonic stenosis. Aorta: The aortic root is normal in size and structure. There is borderline dilatation of the aortic root, measuring 38 mm. Venous: The inferior vena cava is normal in size with greater than 50% respiratory variability, suggesting right atrial pressure of 3 mmHg. IAS/Shunts: No atrial level shunt detected by color flow Doppler.  LEFT VENTRICLE PLAX 2D LVIDd:         5.60 cm   Diastology LVIDs:         3.80 cm   LV e' medial:    6.20 cm/s LV PW:         1.20 cm   LV E/e' medial:  12.5 LV IVS:        1.20 cm   LV e' lateral:   9.25 cm/s LVOT diam:     2.20 cm   LV E/e' lateral: 8.4 LV SV:         117 LV SV Index:   48 LVOT Area:     3.80 cm  RIGHT VENTRICLE RV Basal diam:  4.00 cm RV Mid diam:    3.30 cm RV S prime:      22.20 cm/s TAPSE (M-mode): 2.8 cm LEFT ATRIUM             Index        RIGHT ATRIUM  Index LA diam:        4.80 cm 1.96 cm/m   RA Area:     19.00 cm LA Vol (A2C):   75.0 ml 30.57 ml/m  RA Volume:   55.90 ml  22.79 ml/m LA Vol (A4C):   53.8 ml 21.93 ml/m LA Biplane Vol: 63.4 ml 25.84 ml/m  AORTIC VALVE                     PULMONIC VALVE AV Area (Vmax):    2.93 cm      PV Vmax:       1.24 m/s AV Area (Vmean):   2.72 cm      PV Peak grad:  6.2 mmHg AV Area (VTI):     3.29 cm AV Vmax:           166.00 cm/s AV Vmean:          119.000 cm/s AV VTI:            0.356 m AV Peak Grad:      11.0 mmHg AV Mean Grad:      7.0 mmHg LVOT Vmax:         128.00 cm/s LVOT Vmean:        85.000 cm/s LVOT VTI:          0.308 m LVOT/AV VTI ratio: 0.87  AORTA Ao Root diam: 3.80 cm Ao Asc diam:  3.50 cm MITRAL VALVE                TRICUSPID VALVE MV Area (PHT): 4.99 cm     TR Peak grad:   19.7 mmHg MV Area VTI:   4.35 cm     TR Vmax:        222.00 cm/s MV Peak grad:  7.0 mmHg MV Mean grad:  3.0 mmHg     SHUNTS MV Vmax:       1.32 m/s     Systemic VTI:  0.31 m MV Vmean:      86.2 cm/s    Systemic Diam: 2.20 cm MV Decel Time: 152 msec MV E velocity: 77.60 cm/s MV A velocity: 120.00 cm/s MV E/A ratio:  0.65 Julien Nordmann MD Electronically signed by Julien Nordmann MD Signature Date/Time: 08/30/2022/2:35:16 PM    Final    CT Angio Chest Pulmonary Embolism (PE) W or WO Contrast  Result Date: 08/30/2022 CLINICAL DATA:  Pulmonary embolism suspected, high probability. Shortness breath and cough. EXAM: CT ANGIOGRAPHY CHEST WITH CONTRAST TECHNIQUE: Multidetector CT imaging of the chest was performed using the standard protocol during bolus administration of intravenous contrast. Multiplanar CT image reconstructions and MIPs were obtained to evaluate the vascular anatomy. RADIATION DOSE REDUCTION: This exam was performed according to the departmental dose-optimization program which includes automated exposure control, adjustment  of the mA and/or kV according to patient size and/or use of iterative reconstruction technique. CONTRAST:  75mL OMNIPAQUE IOHEXOL 350 MG/ML SOLN COMPARISON:  One-view chest x-ray 08/29/2022 FINDINGS: Cardiovascular: The heart size is normal. Coronary artery calcifications are present. No significant pericardial effusion is present. Minimal atherosclerotic calcifications are present in the aorta and branch vessels. No aneurysm is present. Great vessel origins are within normal limits. Pulmonary artery opacification is satisfactory. No focal filling defects are present to suggest pulmonary embolus. Pulmonary artery size normal. Mediastinum/Nodes: No enlarged mediastinal, hilar, or axillary lymph nodes. Thyroid gland, trachea, and esophagus demonstrate no significant findings. Lungs/Pleura: Lungs are clear. No pleural effusion or pneumothorax. Upper Abdomen: The  visualized upper abdomen unremarkable. Musculoskeletal: Vertebral body heights and alignment are normal. Fused anterior osteophytes span across multiple levels. No focal osseous lesions present. Ribs and sternum are within normal limits. Review of the MIP images confirms the above findings. IMPRESSION: 1. No pulmonary embolus. 2. Coronary artery disease. 3. No acute or focal lesion to explain the patient's symptoms. Electronically Signed   By: Marin Roberts M.D.   On: 08/30/2022 10:34   DG Chest Port 1 View  Result Date: 08/29/2022 CLINICAL DATA:  Shortness of breath EXAM: PORTABLE CHEST 1 VIEW COMPARISON:  07/16/2017 FINDINGS: Heart and mediastinal contours within normal limits. Increased opacity in the right infrahilar region. No confluent opacity on the left. No effusions or acute bony abnormality. IMPRESSION: Right infrahilar atelectasis or infiltrate. Electronically Signed   By: Charlett Nose M.D.   On: 08/29/2022 23:09      Subjective: Seen and examined on the day of discharge.  Stable no distress.  Appropriate for discharge  home.  Discharge Exam: Vitals:   09/01/22 0736 09/01/22 0817  BP:  (!) 154/75  Pulse:  100  Resp:  18  Temp:  97.8 F (36.6 C)  SpO2: 100% 97%   Vitals:   08/31/22 1927 08/31/22 2307 09/01/22 0736 09/01/22 0817  BP:  138/73  (!) 154/75  Pulse:  84  100  Resp:  16  18  Temp:  99 F (37.2 C)  97.8 F (36.6 C)  TempSrc:      SpO2: 96% 95% 100% 97%  Weight:      Height:        General: Pt is alert, awake, not in acute distress Cardiovascular: RRR, S1/S2 +, no rubs, no gallops Respiratory: Decreased air entry bilaterally.  Improved from prior.  No wheeze.  Normal work of breathing.  Room air Abdominal: Soft, NT, ND, bowel sounds + Extremities: no edema, no cyanosis    The results of significant diagnostics from this hospitalization (including imaging, microbiology, ancillary and laboratory) are listed below for reference.     Microbiology: Recent Results (from the past 240 hour(s))  Culture, blood (routine x 2)     Status: None (Preliminary result)   Collection Time: 08/29/22 11:35 PM   Specimen: BLOOD  Result Value Ref Range Status   Specimen Description BLOOD  RAC  Final   Special Requests   Final    BOTTLES DRAWN AEROBIC AND ANAEROBIC Blood Culture adequate volume   Culture  Setup Time   Final    GRAM POSITIVE RODS AEROBIC BOTTLE ONLY CRITICAL RESULT CALLED TO, READ BACK BY AND VERIFIED WITH: NATHAN BELUE AT 0554 08/31/22.PMF Performed at [facility], [address] [place], [place] [address]    Culture GRAM POSITIVE RODS  Final   Report Status PENDING  Incomplete  Culture, blood (routine x 2)     Status: None (Preliminary result)   Collection Time: 08/29/22 11:35 PM   Specimen: BLOOD  Result Value Ref Range Status   Specimen Description BLOOD  RH  Final   Special Requests   Final    BOTTLES DRAWN AEROBIC AND ANAEROBIC Blood Culture adequate volume   Culture   Final    NO GROWTH 3 DAYS Performed at [facility], [address]  [place], [place] [address]    Report Status PENDING  Incomplete  SARS Coronavirus 2 by RT PCR (hospital order, performed in [facility] hospital lab) *cepheid single result test* Anterior Nasal Swab     Status: None  Collection Time: 08/29/22 11:35 PM   Specimen: Anterior Nasal Swab  Result Value Ref Range Status   SARS Coronavirus 2 by RT PCR NEGATIVE NEGATIVE Final    Comment: (NOTE) SARS-CoV-2 target nucleic acids are NOT DETECTED.  The SARS-CoV-2 RNA is generally detectable in upper and lower respiratory specimens during the acute phase of infection. The lowest concentration of SARS-CoV-2 viral copies this assay can detect is 250 copies / mL. A negative result does not preclude SARS-CoV-2 infection and should not be used as the sole basis for treatment or other patient management decisions.  A negative result may occur with improper specimen collection / handling, submission of specimen other than nasopharyngeal swab, presence of viral mutation(s) within the areas targeted by this assay, and inadequate number of viral copies (<250 copies / mL). A negative result must be combined with clinical observations, patient history, and epidemiological information.  Fact Sheet for Patients:   RoadLapTop.co.za  Fact Sheet for Healthcare Providers: http://kim-miller.com/  This test is not yet approved or  cleared by the [place] FDA and has been authorized for detection and/or diagnosis of SARS-CoV-2 by FDA under an Emergency Use Authorization (EUA).  This EUA will remain in effect (meaning this test can be used) for the duration of the COVID-19 declaration under Section 564(b)(1) of the Act, 21 U.S.C. section 360bbb-3(b)(1), unless the authorization is terminated or revoked sooner.  Performed at [facility], [address] [place], [place] [address]   Respiratory (~20 pathogens) panel by PCR     Status: None   Collection Time:  08/30/22 12:39 AM   Specimen: Nasopharyngeal Swab; Respiratory  Result Value Ref Range Status   Adenovirus NOT DETECTED NOT DETECTED Final   Coronavirus 229E NOT DETECTED NOT DETECTED Final    Comment: (NOTE) The Coronavirus on the Respiratory Panel, DOES NOT test for the novel  Coronavirus (2019 nCoV)    Coronavirus HKU1 NOT DETECTED NOT DETECTED Final   Coronavirus NL63 NOT DETECTED NOT DETECTED Final   Coronavirus OC43 NOT DETECTED NOT DETECTED Final   Metapneumovirus NOT DETECTED NOT DETECTED Final   Rhinovirus / Enterovirus NOT DETECTED NOT DETECTED Final   Influenza A NOT DETECTED NOT DETECTED Final   Influenza B NOT DETECTED NOT DETECTED Final   Parainfluenza Virus 1 NOT DETECTED NOT DETECTED Final   Parainfluenza Virus 2 NOT DETECTED NOT DETECTED Final   Parainfluenza Virus 3 NOT DETECTED NOT DETECTED Final   Parainfluenza Virus 4 NOT DETECTED NOT DETECTED Final   Respiratory Syncytial Virus NOT DETECTED NOT DETECTED Final   Bordetella pertussis NOT DETECTED NOT DETECTED Final   Bordetella Parapertussis NOT DETECTED NOT DETECTED Final   Chlamydophila pneumoniae NOT DETECTED NOT DETECTED Final   Mycoplasma pneumoniae NOT DETECTED NOT DETECTED Final    Comment: Performed at [facility], [address], [place], [place] [address]     Labs: BNP (last 3 results) Recent Labs    08/30/22 0319  BNP 56.0   Basic Metabolic Panel: Recent Labs  Lab 08/29/22 2251 08/30/22 0039 08/30/22 0319  NA 134*  --  140  K 3.8  --  4.1  CL 102  --  108  CO2 22  --  25  GLUCOSE 127*  --  133*  BUN 6*  --  6*  CREATININE 0.89 0.78 0.79  CALCIUM 8.7*  --  8.8*   Liver Function Tests: No results for input(s): "AST", "ALT", "ALKPHOS", "BILITOT", "PROT", "ALBUMIN" in the last 168 hours. No  results for input(s): "LIPASE", "AMYLASE" in the last 168 hours. No results for input(s): "AMMONIA" in the last 168 hours. CBC: Recent Labs  Lab 08/29/22 2251 08/30/22 0039 08/30/22 0319   WBC 12.2* 7.9 7.3  NEUTROABS  --  5.3  --   HGB 14.6 13.3 13.9  HCT 41.8 38.8* 39.9  MCV 97.4 97.7 97.3  PLT 220 195 194   Cardiac Enzymes: No results for input(s): "CKTOTAL", "CKMB", "CKMBINDEX", "TROPONINI" in the last 168 hours. BNP: Invalid input(s): "POCBNP" CBG: No results for input(s): "GLUCAP" in the last 168 hours. D-Dimer No results for input(s): "DDIMER" in the last 72 hours. Hgb A1c No results for input(s): "HGBA1C" in the last 72 hours. Lipid Profile No results for input(s): "CHOL", "HDL", "LDLCALC", "TRIG", "CHOLHDL", "LDLDIRECT" in the last 72 hours. Thyroid function studies No results for input(s): "TSH", "T4TOTAL", "T3FREE", "THYROIDAB" in the last 72 hours.  Invalid input(s): "FREET3" Anemia work up No results for input(s): "VITAMINB12", "FOLATE", "FERRITIN", "TIBC", "IRON", "RETICCTPCT" in the last 72 hours. Urinalysis    Component Value Date/Time   COLORURINE YELLOW (A) 02/02/2022 0057   APPEARANCEUR CLEAR (A) 02/02/2022 0057   LABSPEC 1.004 (L) 02/02/2022 0057   PHURINE 6.0 02/02/2022 0057   GLUCOSEU NEGATIVE 02/02/2022 0057   HGBUR NEGATIVE 02/02/2022 0057   BILIRUBINUR NEGATIVE 02/02/2022 0057   KETONESUR NEGATIVE 02/02/2022 0057   PROTEINUR NEGATIVE 02/02/2022 0057   NITRITE NEGATIVE 02/02/2022 0057   LEUKOCYTESUR NEGATIVE 02/02/2022 0057   Sepsis Labs Recent Labs  Lab 08/29/22 2251 08/30/22 0039 08/30/22 0319  WBC 12.2* 7.9 7.3   Microbiology Recent Results (from the past 240 hour(s))  Culture, blood (routine x 2)     Status: None (Preliminary result)   Collection Time: 08/29/22 11:35 PM   Specimen: BLOOD  Result Value Ref Range Status   Specimen Description BLOOD  RAC  Final   Special Requests   Final    BOTTLES DRAWN AEROBIC AND ANAEROBIC Blood Culture adequate volume   Culture  Setup Time   Final    GRAM POSITIVE RODS AEROBIC BOTTLE ONLY CRITICAL RESULT CALLED TO, READ BACK BY AND VERIFIED WITH: NATHAN BELUE AT 0554  08/31/22.PMF Performed at [facility], [address] [place], [place] [address]    Culture GRAM POSITIVE RODS  Final   Report Status PENDING  Incomplete  Culture, blood (routine x 2)     Status: None (Preliminary result)   Collection Time: 08/29/22 11:35 PM   Specimen: BLOOD  Result Value Ref Range Status   Specimen Description BLOOD  RH  Final   Special Requests   Final    BOTTLES DRAWN AEROBIC AND ANAEROBIC Blood Culture adequate volume   Culture   Final    NO GROWTH 3 DAYS Performed at [facility], [address] [place], [place] [address]    Report Status PENDING  Incomplete  SARS Coronavirus 2 by RT PCR (hospital order, performed in [facility] hospital lab) *cepheid single result test* Anterior Nasal Swab     Status: None   Collection Time: 08/29/22 11:35 PM   Specimen: Anterior Nasal Swab  Result Value Ref Range Status   SARS Coronavirus 2 by RT PCR NEGATIVE NEGATIVE Final    Comment: (NOTE) SARS-CoV-2 target nucleic acids are NOT DETECTED.  The SARS-CoV-2 RNA is generally detectable in upper and lower respiratory specimens during the acute phase of infection. The lowest concentration of SARS-CoV-2 viral copies this assay can detect is 250 copies / mL. A  negative result does not preclude SARS-CoV-2 infection and should not be used as the sole basis for treatment or other patient management decisions.  A negative result may occur with improper specimen collection / handling, submission of specimen other than nasopharyngeal swab, presence of viral mutation(s) within the areas targeted by this assay, and inadequate number of viral copies (<250 copies / mL). A negative result must be combined with clinical observations, patient history, and epidemiological information.  Fact Sheet for Patients:   RoadLapTop.co.za  Fact Sheet for Healthcare Providers: http://kim-miller.com/  This test is not yet approved  or  cleared by the [place] FDA and has been authorized for detection and/or diagnosis of SARS-CoV-2 by FDA under an Emergency Use Authorization (EUA).  This EUA will remain in effect (meaning this test can be used) for the duration of the COVID-19 declaration under Section 564(b)(1) of the Act, 21 U.S.C. section 360bbb-3(b)(1), unless the authorization is terminated or revoked sooner.  Performed at [facility], [address] [place], [place] [address]   Respiratory (~20 pathogens) panel by PCR     Status: None   Collection Time: 08/30/22 12:39 AM   Specimen: Nasopharyngeal Swab; Respiratory  Result Value Ref Range Status   Adenovirus NOT DETECTED NOT DETECTED Final   Coronavirus 229E NOT DETECTED NOT DETECTED Final    Comment: (NOTE) The Coronavirus on the Respiratory Panel, DOES NOT test for the novel  Coronavirus (2019 nCoV)    Coronavirus HKU1 NOT DETECTED NOT DETECTED Final   Coronavirus NL63 NOT DETECTED NOT DETECTED Final   Coronavirus OC43 NOT DETECTED NOT DETECTED Final   Metapneumovirus NOT DETECTED NOT DETECTED Final   Rhinovirus / Enterovirus NOT DETECTED NOT DETECTED Final   Influenza A NOT DETECTED NOT DETECTED Final   Influenza B NOT DETECTED NOT DETECTED Final   Parainfluenza Virus 1 NOT DETECTED NOT DETECTED Final   Parainfluenza Virus 2 NOT DETECTED NOT DETECTED Final   Parainfluenza Virus 3 NOT DETECTED NOT DETECTED Final   Parainfluenza Virus 4 NOT DETECTED NOT DETECTED Final   Respiratory Syncytial Virus NOT DETECTED NOT DETECTED Final   Bordetella pertussis NOT DETECTED NOT DETECTED Final   Bordetella Parapertussis NOT DETECTED NOT DETECTED Final   Chlamydophila pneumoniae NOT DETECTED NOT DETECTED Final   Mycoplasma pneumoniae NOT DETECTED NOT DETECTED Final    Comment: Performed at [facility], [address], [place], [place] [address]     Time coordinating discharge: Over 30 minutes  SIGNED:   Tresa Moore,  MD  [facility] 09/01/2022, 9:53 AM Pager   If 7PM-7AM, please contact night-coverage

## 2023-05-21 ENCOUNTER — Encounter: Payer: Self-pay | Admitting: Ophthalmology

## 2023-05-21 NOTE — Anesthesia Preprocedure Evaluation (Addendum)
 Anesthesia Evaluation  Patient identified by MRN, date of birth, ID band Patient awake    Reviewed: Allergy & Precautions, H&P , NPO status , Patient's Chart, lab work & pertinent test results  Airway Mallampati: IV     Mouth opening: Limited Mouth Opening Comment: I would expect difficult airway; large tongue, limited oral opening, OSA on CPAP, short, thick neck Dental  (+) Caps Upper caps, front teeth:   Pulmonary pneumonia, COPD, former smoker          Cardiovascular hypertension,   08-30-22 echo: 1. Left ventricular ejection fraction, by estimation, is 60 to 65%. The  left ventricle has normal function. The left ventricle has no regional  wall motion abnormalities. There is mild left ventricular hypertrophy.  Left ventricular diastolic parameters  are consistent with Grade I diastolic dysfunction (impaired relaxation).   2. Right ventricular systolic function is normal. The right ventricular  size is normal. There is normal pulmonary artery systolic pressure. The  estimated right ventricular systolic pressure is 22.7 mmHg.   3. The mitral valve is normal in structure. No evidence of mitral valve  regurgitation. No evidence of mitral stenosis.   4. The aortic valve is tricuspid. Aortic valve regurgitation is not  visualized. No aortic stenosis is present.   5. There is borderline dilatation of the aortic root, measuring 38 mm.   6. The inferior vena cava is normal in size with greater than 50%  respiratory variability, suggesting right atrial pressure of 3 mmHg.     Neuro/Psych  PSYCHIATRIC DISORDERS Anxiety Depression    negative neurological ROS  negative psych ROS   GI/Hepatic negative GI ROS, Neg liver ROS,,,  Endo/Other  negative endocrine ROS    Renal/GU negative Renal ROS  negative genitourinary   Musculoskeletal negative musculoskeletal ROS (+)    Abdominal   Peds negative pediatric ROS (+)   Hematology negative hematology ROS (+)   Anesthesia Other Findings Hypertension  Back pain Knee pain  Prostate cancer (HCC) Reactive airway disease with wheezing  COPD, mild (HCC) Gout Back pain Grade I diastolic dysfunction     Reproductive/Obstetrics negative OB ROS                              Anesthesia Physical Anesthesia Plan  ASA: 3  Anesthesia Plan: MAC   Post-op Pain Management:    Induction: Intravenous  PONV Risk Score and Plan:   Airway Management Planned: Natural Airway and Nasal Cannula  Additional Equipment:   Intra-op Plan:   Post-operative Plan:   Informed Consent: I have reviewed the patients History and Physical, chart, labs and discussed the procedure including the risks, benefits and alternatives for the proposed anesthesia with the patient or authorized representative who has indicated his/her understanding and acceptance.     Dental Advisory Given  Plan Discussed with: Anesthesiologist, CRNA and Surgeon  Anesthesia Plan Comments: (Patient consented for risks of anesthesia including but not limited to:  - adverse reactions to medications - damage to eyes, teeth, lips or other oral mucosa - nerve damage due to positioning  - sore throat or hoarseness - Damage to heart, brain, nerves, lungs, other parts of body or loss of life  Patient voiced understanding and assent.)         Anesthesia Quick Evaluation

## 2023-05-28 NOTE — Discharge Instructions (Signed)

## 2023-06-02 ENCOUNTER — Encounter: Payer: Self-pay | Admitting: Ophthalmology

## 2023-06-02 ENCOUNTER — Ambulatory Visit: Payer: Self-pay | Admitting: Anesthesiology

## 2023-06-02 ENCOUNTER — Other Ambulatory Visit: Payer: Self-pay

## 2023-06-02 ENCOUNTER — Encounter
Admission: RE | Disposition: A | Discharge status: Home / Self Care | Payer: Self-pay | Source: Home / Self Care | Attending: Ophthalmology

## 2023-06-02 ENCOUNTER — Ambulatory Visit
Admission: RE | Admit: 2023-06-02 | Discharge: 2023-06-02 | Disposition: A | Discharge status: Home / Self Care | Attending: Ophthalmology | Admitting: Ophthalmology

## 2023-06-02 DIAGNOSIS — I10 Essential (primary) hypertension: Secondary | ICD-10-CM | POA: Diagnosis not present

## 2023-06-02 DIAGNOSIS — H2511 Age-related nuclear cataract, right eye: Secondary | ICD-10-CM | POA: Diagnosis present

## 2023-06-02 DIAGNOSIS — Z87891 Personal history of nicotine dependence: Secondary | ICD-10-CM | POA: Insufficient documentation

## 2023-06-02 DIAGNOSIS — J4489 Other specified chronic obstructive pulmonary disease: Secondary | ICD-10-CM | POA: Insufficient documentation

## 2023-06-02 DIAGNOSIS — Z7951 Long term (current) use of inhaled steroids: Secondary | ICD-10-CM | POA: Diagnosis not present

## 2023-06-02 HISTORY — DX: Chronic obstructive pulmonary disease, unspecified: J44.9

## 2023-06-02 HISTORY — DX: Other ill-defined heart diseases: I51.89

## 2023-06-02 HISTORY — DX: Unspecified asthma, uncomplicated: J45.909

## 2023-06-02 HISTORY — DX: Gout, unspecified: M10.9

## 2023-06-02 HISTORY — PX: CATARACT EXTRACTION W/PHACO: SHX586

## 2023-06-02 SURGERY — PHACOEMULSIFICATION, CATARACT, WITH IOL INSERTION
Anesthesia: Monitor Anesthesia Care | Site: Eye | Laterality: Right

## 2023-06-02 MED ORDER — FENTANYL CITRATE (PF) 100 MCG/2ML IJ SOLN
INTRAMUSCULAR | Status: AC
  Filled 2023-06-02: qty 2

## 2023-06-02 MED ORDER — MIDAZOLAM HCL 2 MG/2ML IJ SOLN
INTRAMUSCULAR | Status: DC | PRN
  Administered 2023-06-02: 2 mg via INTRAVENOUS

## 2023-06-02 MED ORDER — SIGHTPATH DOSE#1 BSS IO SOLN
INTRAOCULAR | Status: DC | PRN
  Administered 2023-06-02: 88 mL via OPHTHALMIC

## 2023-06-02 MED ORDER — FENTANYL CITRATE (PF) 100 MCG/2ML IJ SOLN
INTRAMUSCULAR | Status: DC | PRN
  Administered 2023-06-02 (×2): 50 ug via INTRAVENOUS

## 2023-06-02 MED ORDER — TETRACAINE HCL 0.5 % OP SOLN
1.0000 [drp] | OPHTHALMIC | Status: AC
  Administered 2023-06-02 (×3): 1 [drp] via OPHTHALMIC

## 2023-06-02 MED ORDER — SIGHTPATH DOSE#1 BSS IO SOLN
INTRAOCULAR | Status: DC | PRN
  Administered 2023-06-02: 15 mL via INTRAOCULAR

## 2023-06-02 MED ORDER — SIGHTPATH DOSE#1 NA HYALUR & NA CHOND-NA HYALUR IO KIT
PACK | INTRAOCULAR | Status: DC | PRN
Start: 2023-06-02 — End: 2023-06-02
  Administered 2023-06-02: 1 via OPHTHALMIC

## 2023-06-02 MED ORDER — MOXIFLOXACIN HCL 0.5 % OP SOLN
OPHTHALMIC | Status: DC | PRN
  Administered 2023-06-02: .2 mL via OPHTHALMIC

## 2023-06-02 MED ORDER — LIDOCAINE HCL (PF) 2 % IJ SOLN
INTRAOCULAR | Status: DC | PRN
  Administered 2023-06-02: 4 mL via INTRAOCULAR

## 2023-06-02 MED ORDER — MIDAZOLAM HCL 2 MG/2ML IJ SOLN
INTRAMUSCULAR | Status: AC
Start: 2023-06-02 — End: ?
  Filled 2023-06-02: qty 2

## 2023-06-02 MED ORDER — ARMC OPHTHALMIC DILATING DROPS
1.0000 | OPHTHALMIC | Status: AC
  Administered 2023-06-02 (×3): 1 via OPHTHALMIC

## 2023-06-02 SURGICAL SUPPLY — 12 items
CATARACT SUITE SIGHTPATH (MISCELLANEOUS) ×1 IMPLANT
DISSECTOR HYDRO NUCLEUS 50X22 (MISCELLANEOUS) ×2 IMPLANT
FEE CATARACT SUITE SIGHTPATH (MISCELLANEOUS) ×2 IMPLANT
GLOVE PI ULTRA LF STRL 7.5 (GLOVE) ×2 IMPLANT
GLOVE SURG POLYISOPRENE 8.5 (GLOVE) ×1 IMPLANT
GLOVE SURG PROTEXIS BL SZ6.5 (GLOVE) ×1 IMPLANT
GLOVE SURG SYN 6.5 PF PI BL (GLOVE) ×2 IMPLANT
GLOVE SURG SYN 8.5 PF PI BL (GLOVE) ×2 IMPLANT
LENS IOL TECNIS EYHANCE 24.0 (Intraocular Lens) IMPLANT
NDL FILTER BLUNT 18X1 1/2 (NEEDLE) ×2 IMPLANT
NEEDLE FILTER BLUNT 18X1 1/2 (NEEDLE) ×1 IMPLANT
SYR 3ML LL SCALE MARK (SYRINGE) ×2 IMPLANT

## 2023-06-02 NOTE — H&P (Signed)
 [facility]   Primary Care Physician:  Little Riff, Jeffery Ophthalmologist: Dr. Dusty Gin  Pre-Procedure History & Physical: HPI:  Jeffery Meyer is a 68 y.o. male here for cataract surgery.   Past Medical History:  Diagnosis Date   Back pain    Back pain    COPD, mild (HCC)    Gout    Grade I diastolic dysfunction    Hypertension    Knee pain    Prostate cancer (HCC)    Reactive airway disease with wheezing     Past Surgical History:  Procedure Laterality Date   PROSTATE SURGERY      Prior to Admission medications   Medication Sig Start Date End Date Taking? Authorizing Provider  atenolol  (TENORMIN ) 50 MG tablet Take 1 tablet (50 mg total) by mouth daily. 10/22/16  Yes Ulysees Gander, Jeffery  atorvastatin  (LIPITOR) 10 MG tablet Take 1 tablet (10 mg total) by mouth daily. 10/22/16  Yes Ulysees Gander, Jeffery  citalopram  (CELEXA ) 40 MG tablet Take 40 mg by mouth daily.   Yes Provider, Historical, Jeffery  mometasone  (NASONEX ) 50 MCG/ACT nasal spray Place 2 sprays into the nose daily. 04/24/16  Yes Ulysees Gander, Jeffery    Allergies as of 05/16/2023   (No Known Allergies)    Family History  Problem Relation Age of Onset   Hypertension Mother    Hypertension Father    Hypertension Sister    Hypertension Brother     Social History   Socioeconomic History   Marital status: Widowed    Spouse name: Not on file   Number of children: Not on file   Years of education: Not on file   Highest education level: Not on file  Occupational History   Not on file  Tobacco Use   Smoking status: Former   Smokeless tobacco: Never  Substance and Sexual Activity   Alcohol use: Yes    Alcohol/week: 21.0 standard drinks of alcohol    Types: 21 Cans of beer per week    Comment: 2-3 beers a day   Drug use: No   Sexual activity: Not Currently  Other Topics Concern   Not on file  Social History Narrative   Not on file   Social Drivers of Health   Financial Resource Strain:  Low Risk  (04/29/2023)   Received from [facility]   Overall Financial Resource Strain (CARDIA)    Difficulty of Paying Living Expenses: Not hard at all  Food Insecurity: No Food Insecurity (04/29/2023)   Received from [facility]   Hunger Vital Sign    Worried About Running Out of Food in the Last Year: Never true    Ran Out of Food in the Last Year: Never true  Transportation Needs: No Transportation Needs (04/29/2023)   Received from [facility]    In the past 12 months, has lack of transportation kept you from medical appointments or from getting medications?: No    Lack of Transportation (Non-Medical): No  Physical Activity: Not on file  Stress: Not on file  Social Connections: Not on file  Intimate Partner Violence: Not At Risk (08/30/2022)   Humiliation, Afraid, Rape, and Kick questionnaire    Fear of Current or Ex-Partner: No    Emotionally Abused: No    Physically Abused: No    Sexually Abused: No    Review of Systems: See HPI, otherwise  negative ROS  Physical Exam: BP (!) 152/86   Pulse 69   Temp 98.9 F (37.2 C) (Temporal)   Resp 17   Ht 6\' 3"  (1.905 m)   Wt 126.2 kg   SpO2 97%   BMI 34.77 kg/m  General:   Alert, cooperative. Head:  Normocephalic and atraumatic. Respiratory:  Normal work of breathing. Cardiovascular:  NAD  Impression/Plan: Jeffery Meyer is here for cataract surgery.  Risks, benefits, limitations, and alternatives regarding cataract surgery have been reviewed with the patient.  Questions have been answered.  All parties agreeable.   Dusty Gin, Jeffery  06/02/2023, 9:01 AM

## 2023-06-02 NOTE — Op Note (Signed)
 OPERATIVE NOTE  ARSENE DESHAZIER [id number] 06/02/2023   PREOPERATIVE DIAGNOSIS:  Nuclear sclerotic cataract right eye.  H25.11   POSTOPERATIVE DIAGNOSIS:    Nuclear sclerotic cataract right eye.     PROCEDURE:  Phacoemusification with posterior chamber intraocular lens placement of the right eye   LENS:   Implant Name Type Inv. Item Serial No. Manufacturer Lot No. LRB No. Used Action  LENS IOL TECNIS EYHANCE 24.0 - [id number] Intraocular Lens LENS IOL TECNIS EYHANCE 24.0 [id number] [employer]  Right 1 Implanted       Procedure(s): PHACOEMULSIFICATION, CATARACT, WITH IOL  4.35 00:35.8 (Right)  SURGEON:  Dusty Gin, MD, MPH  ANESTHESIOLOGIST: Anesthesiologist: Emilie Harden, MD CRNA: Coley Davis, CRNA   ANESTHESIA:  Topical with tetracaine drops augmented with 1% preservative-free intracameral lidocaine.  ESTIMATED BLOOD LOSS: less than 1 mL.   COMPLICATIONS:  None.   DESCRIPTION OF PROCEDURE:  The patient was identified in the holding room and transported to the operating room and placed in the supine position under the operating microscope.  The right eye was identified as the operative eye and it was prepped and draped in the usual sterile ophthalmic fashion.   A 1.0 millimeter clear-corneal paracentesis was made at the 10:30 position. 0.5 ml of preservative-free 1% lidocaine with epinephrine was injected into the anterior chamber.  The anterior chamber was filled with viscoelastic.  A 2.4 millimeter keratome was used to make a near-clear corneal incision at the 8:00 position.  A curvilinear capsulorrhexis was made with a cystotome and capsulorrhexis forceps.  Balanced salt solution was used to hydrodissect and hydrodelineate the nucleus.   Phacoemulsification was then used in stop and chop fashion to remove the lens nucleus and epinucleus.  The remaining cortex was then removed using the irrigation and aspiration handpiece. Viscoelastic was then placed into the capsular  bag to distend it for lens placement.  A lens was then injected into the capsular bag.  The remaining viscoelastic was aspirated.   Wounds were hydrated with balanced salt solution.  The anterior chamber was inflated to a physiologic pressure with balanced salt solution.   Intracameral vigamox 0.1 mL undiluted was injected into the eye and a drop placed onto the ocular surface.  No wound leaks were noted.  The patient was taken to the recovery room in stable condition without complications of anesthesia or surgery  Ova Voth 06/02/2023, 9:26 AM

## 2023-06-02 NOTE — Anesthesia Postprocedure Evaluation (Signed)
 Anesthesia Post Note  Patient: SAHEJ AMBERSON  Procedure(s) Performed: PHACOEMULSIFICATION, CATARACT, WITH IOL  4.35 00:35.8 (Right: Eye)  Patient location during evaluation: PACU Anesthesia Type: MAC Level of consciousness: awake and alert Pain management: pain level controlled Vital Signs Assessment: post-procedure vital signs reviewed and stable Respiratory status: spontaneous breathing, nonlabored ventilation, respiratory function stable and patient connected to nasal cannula oxygen Cardiovascular status: stable and blood pressure returned to baseline Postop Assessment: no apparent nausea or vomiting Anesthetic complications: no   No notable events documented.   Last Vitals:  Vitals:   06/02/23 0927 06/02/23 0931  BP: (!) 143/84 (!) 146/80  Pulse: 72 70  Resp: 14 15  Temp: (!) 36.4 C (!) 36.4 C  SpO2: 96% 95%    Last Pain:  Vitals:   06/02/23 0931  TempSrc:   PainSc: 0-No pain                 Emilie Harden

## 2023-06-02 NOTE — Transfer of Care (Signed)
 Immediate Anesthesia Transfer of Care Note  Patient: Jeffery Meyer  Procedure(s) Performed: PHACOEMULSIFICATION, CATARACT, WITH IOL  4.35 00:35.8 (Right: Eye)  Patient Location: PACU  Anesthesia Type: MAC  Level of Consciousness: awake, alert  and patient cooperative  Airway and Oxygen Therapy: Patient Spontanous Breathing and Patient connected to supplemental oxygen  Post-op Assessment: Post-op Vital signs reviewed, Patient's Cardiovascular Status Stable, Respiratory Function Stable, Patent Airway and No signs of Nausea or vomiting  Post-op Vital Signs: Reviewed and stable  Complications: No notable events documented.

## 2023-06-03 ENCOUNTER — Encounter: Payer: Self-pay | Admitting: Ophthalmology

## 2023-06-03 NOTE — Anesthesia Preprocedure Evaluation (Addendum)
 Anesthesia Evaluation  Patient identified by MRN, date of birth, ID band Patient awake    Reviewed: Allergy & Precautions, H&P , NPO status , Patient's Chart, lab work & pertinent test results  Airway Mallampati: IV  TM Distance: >3 FB Neck ROM: full   Comment: Mouth opening: Limited Mouth Opening Comment: I would expect difficult airway; large tongue, limited oral opening, OSA on CPAP, short, thick neck  Dental no notable dental hx. (+) Caps Upper caps, front teeth:   Pulmonary COPD, former smoker   Pulmonary exam normal        Cardiovascular hypertension, Normal cardiovascular exam  08-30-22 echo: 1. Left ventricular ejection fraction, by estimation, is 60 to 65%. The  left ventricle has normal function. The left ventricle has no regional  wall motion abnormalities. There is mild left ventricular hypertrophy.  Left ventricular diastolic parameters  are consistent with Grade I diastolic dysfunction (impaired relaxation).   2. Right ventricular systolic function is normal. The right ventricular  size is normal. There is normal pulmonary artery systolic pressure. The  estimated right ventricular systolic pressure is 22.7 mmHg.   3. The mitral valve is normal in structure. No evidence of mitral valve  regurgitation. No evidence of mitral stenosis.   4. The aortic valve is tricuspid. Aortic valve regurgitation is not  visualized. No aortic stenosis is present.   5. There is borderline dilatation of the aortic root, measuring 38 mm.   6. The inferior vena cava is normal in size with greater than 50%  respiratory variability, suggesting right atrial pressure of 3 mmHg.     Neuro/Psych  PSYCHIATRIC DISORDERS      negative neurological ROS     GI/Hepatic negative GI ROS, Neg liver ROS,,,  Endo/Other  negative endocrine ROS    Renal/GU      Musculoskeletal   Abdominal  (+) + obese  Peds  Hematology negative hematology  ROS (+)   Anesthesia Other Findings Previous cataract surgery 06-02-23  Hypertension             Back pain Knee pain     Prostate cancer (HCC) Reactive airway disease with wheezing            COPD, mild (HCC) GoutBack pain Grade I diastolic dysfunction                 Reproductive/Obstetrics negative OB ROS                             Anesthesia Physical Anesthesia Plan  ASA: 3  Anesthesia Plan: MAC   Post-op Pain Management:    Induction: Intravenous  PONV Risk Score and Plan:   Airway Management Planned: Natural Airway and Nasal Cannula  Additional Equipment:   Intra-op Plan:   Post-operative Plan:   Informed Consent: I have reviewed the patients History and Physical, chart, labs and discussed the procedure including the risks, benefits and alternatives for the proposed anesthesia with the patient or authorized representative who has indicated his/her understanding and acceptance.       Plan Discussed with: Anesthesiologist, CRNA and Surgeon  Anesthesia Plan Comments:         Anesthesia Quick Evaluation

## 2023-06-05 NOTE — Discharge Instructions (Signed)

## 2023-06-09 ENCOUNTER — Ambulatory Visit
Admission: RE | Admit: 2023-06-09 | Discharge: 2023-06-09 | Disposition: A | Discharge status: Home / Self Care | Attending: Ophthalmology | Admitting: Ophthalmology

## 2023-06-09 ENCOUNTER — Encounter
Admission: RE | Disposition: A | Discharge status: Home / Self Care | Payer: Self-pay | Source: Home / Self Care | Attending: Ophthalmology

## 2023-06-09 ENCOUNTER — Encounter: Payer: Self-pay | Admitting: Ophthalmology

## 2023-06-09 ENCOUNTER — Ambulatory Visit: Payer: Self-pay | Admitting: Anesthesiology

## 2023-06-09 ENCOUNTER — Other Ambulatory Visit: Payer: Self-pay

## 2023-06-09 DIAGNOSIS — H2512 Age-related nuclear cataract, left eye: Secondary | ICD-10-CM | POA: Diagnosis present

## 2023-06-09 DIAGNOSIS — Z9841 Cataract extraction status, right eye: Secondary | ICD-10-CM | POA: Diagnosis not present

## 2023-06-09 DIAGNOSIS — I10 Essential (primary) hypertension: Secondary | ICD-10-CM | POA: Insufficient documentation

## 2023-06-09 DIAGNOSIS — G4733 Obstructive sleep apnea (adult) (pediatric): Secondary | ICD-10-CM | POA: Diagnosis not present

## 2023-06-09 DIAGNOSIS — Z961 Presence of intraocular lens: Secondary | ICD-10-CM | POA: Insufficient documentation

## 2023-06-09 DIAGNOSIS — E669 Obesity, unspecified: Secondary | ICD-10-CM | POA: Insufficient documentation

## 2023-06-09 DIAGNOSIS — I517 Cardiomegaly: Secondary | ICD-10-CM | POA: Insufficient documentation

## 2023-06-09 DIAGNOSIS — Z6834 Body mass index (BMI) 34.0-34.9, adult: Secondary | ICD-10-CM | POA: Insufficient documentation

## 2023-06-09 DIAGNOSIS — Z79899 Other long term (current) drug therapy: Secondary | ICD-10-CM | POA: Insufficient documentation

## 2023-06-09 DIAGNOSIS — Z87891 Personal history of nicotine dependence: Secondary | ICD-10-CM | POA: Insufficient documentation

## 2023-06-09 DIAGNOSIS — J449 Chronic obstructive pulmonary disease, unspecified: Secondary | ICD-10-CM | POA: Insufficient documentation

## 2023-06-09 HISTORY — PX: CATARACT EXTRACTION W/PHACO: SHX586

## 2023-06-09 SURGERY — PHACOEMULSIFICATION, CATARACT, WITH IOL INSERTION
Anesthesia: Monitor Anesthesia Care | Laterality: Left

## 2023-06-09 MED ORDER — TETRACAINE HCL 0.5 % OP SOLN
OPHTHALMIC | Status: AC
  Filled 2023-06-09: qty 4

## 2023-06-09 MED ORDER — MOXIFLOXACIN HCL 0.5 % OP SOLN
OPHTHALMIC | Status: DC | PRN
  Administered 2023-06-09: .2 mL via OPHTHALMIC

## 2023-06-09 MED ORDER — LIDOCAINE HCL (PF) 2 % IJ SOLN
INTRAOCULAR | Status: DC | PRN
  Administered 2023-06-09: 4 mL via INTRAOCULAR

## 2023-06-09 MED ORDER — MIDAZOLAM HCL 2 MG/2ML IJ SOLN
INTRAMUSCULAR | Status: DC | PRN
  Administered 2023-06-09: 2 mg via INTRAVENOUS

## 2023-06-09 MED ORDER — TETRACAINE HCL 0.5 % OP SOLN
1.0000 [drp] | OPHTHALMIC | Status: AC | PRN
  Administered 2023-06-09 (×3): 1 [drp] via OPHTHALMIC

## 2023-06-09 MED ORDER — FENTANYL CITRATE (PF) 100 MCG/2ML IJ SOLN
INTRAMUSCULAR | Status: DC | PRN
  Administered 2023-06-09: 50 ug via INTRAVENOUS

## 2023-06-09 MED ORDER — SIGHTPATH DOSE#1 BSS IO SOLN
INTRAOCULAR | Status: DC | PRN
  Administered 2023-06-09: 79 mL via OPHTHALMIC

## 2023-06-09 MED ORDER — FENTANYL CITRATE (PF) 100 MCG/2ML IJ SOLN
INTRAMUSCULAR | Status: AC
  Filled 2023-06-09: qty 2

## 2023-06-09 MED ORDER — MIDAZOLAM HCL 2 MG/2ML IJ SOLN
INTRAMUSCULAR | Status: AC
  Filled 2023-06-09: qty 2

## 2023-06-09 MED ORDER — ARMC OPHTHALMIC DILATING DROPS
1.0000 | OPHTHALMIC | Status: AC | PRN
  Administered 2023-06-09 (×3): 1 via OPHTHALMIC

## 2023-06-09 MED ORDER — SIGHTPATH DOSE#1 NA HYALUR & NA CHOND-NA HYALUR IO KIT
PACK | INTRAOCULAR | Status: DC | PRN
Start: 2023-06-09 — End: 2023-06-09
  Administered 2023-06-09: 1 via OPHTHALMIC

## 2023-06-09 MED ORDER — ARMC OPHTHALMIC DILATING DROPS
OPHTHALMIC | Status: AC
  Filled 2023-06-09: qty 0.5

## 2023-06-09 MED ORDER — SIGHTPATH DOSE#1 BSS IO SOLN
INTRAOCULAR | Status: DC | PRN
  Administered 2023-06-09: 15 mL via INTRAOCULAR

## 2023-06-09 SURGICAL SUPPLY — 12 items
CATARACT SUITE SIGHTPATH (MISCELLANEOUS) ×1 IMPLANT
DISSECTOR HYDRO NUCLEUS 50X22 (MISCELLANEOUS) ×2 IMPLANT
FEE CATARACT SUITE SIGHTPATH (MISCELLANEOUS) ×2 IMPLANT
GLOVE PI ULTRA LF STRL 7.5 (GLOVE) ×2 IMPLANT
GLOVE SURG POLYISOPRENE 8.5 (GLOVE) ×1 IMPLANT
GLOVE SURG PROTEXIS BL SZ6.5 (GLOVE) ×1 IMPLANT
GLOVE SURG SYN 6.5 PF PI BL (GLOVE) ×2 IMPLANT
GLOVE SURG SYN 8.5 PF PI BL (GLOVE) ×2 IMPLANT
LENS IOL TECNIS EYHANCE 25.5 (Intraocular Lens) IMPLANT
NDL FILTER BLUNT 18X1 1/2 (NEEDLE) ×2 IMPLANT
NEEDLE FILTER BLUNT 18X1 1/2 (NEEDLE) ×1 IMPLANT
SYR 3ML LL SCALE MARK (SYRINGE) ×2 IMPLANT

## 2023-06-09 NOTE — H&P (Signed)
 [facility]   Primary Care Physician:  Little Riff, MD Ophthalmologist: Dr. Dusty Gin  Pre-Procedure History & Physical: HPI:  Jeffery Meyer is a 68 y.o. male here for cataract surgery.   Past Medical History:  Diagnosis Date   Back pain    Back pain    COPD, mild (HCC)    Gout    Grade I diastolic dysfunction    Hypertension    Knee pain    Prostate cancer (HCC)    Reactive airway disease with wheezing     Past Surgical History:  Procedure Laterality Date   CATARACT EXTRACTION W/PHACO Right 06/02/2023   Procedure: PHACOEMULSIFICATION, CATARACT, WITH IOL  4.35 00:35.8;  Surgeon: Rosa College, MD;  Location: [facility];  Service: Ophthalmology;  Laterality: Right;   PROSTATE SURGERY      Prior to Admission medications   Medication Sig Start Date End Date Taking? Authorizing Provider  atenolol  (TENORMIN ) 50 MG tablet Take 1 tablet (50 mg total) by mouth daily. 10/22/16  Yes Ulysees Gander, MD  atorvastatin  (LIPITOR) 10 MG tablet Take 1 tablet (10 mg total) by mouth daily. 10/22/16   Ulysees Gander, MD  citalopram  (CELEXA ) 40 MG tablet Take 40 mg by mouth daily.    [provider]  mometasone  (NASONEX ) 50 MCG/ACT nasal spray Place 2 sprays into the nose daily. 04/24/16   Ulysees Gander, MD    Allergies as of 05/16/2023   (No Known Allergies)    Family History  Problem Relation Age of Onset   Hypertension Mother    Hypertension Father    Hypertension Sister    Hypertension Brother     Social History   Socioeconomic History   Marital status: Widowed    Spouse name: Not on file   Number of children: Not on file   Years of education: Not on file   Highest education level: Not on file  Occupational History   Not on file  Tobacco Use   Smoking status: Former   Smokeless tobacco: Never  Substance and Sexual Activity   Alcohol use: Yes    Alcohol/week: 21.0 standard drinks of alcohol    Types: 21 Cans of beer per week     Comment: 2-3 beers a day   Drug use: No   Sexual activity: Not Currently  Other Topics Concern   Not on file  Social History Narrative   Not on file   Social Drivers of Health   Financial Resource Strain: Low Risk  (06/03/2023)   Received [facility]   Overall Financial Resource Strain (CARDIA)    Difficulty of Paying Living Expenses: Not very hard  Food Insecurity: No Food Insecurity (06/03/2023)   Received [facility]   Hunger Vital Sign    Worried About Running Out of Food in the Last Year: Never true    Ran Out of Food in the Last Year: Never true  Transportation Needs: No Transportation Needs (06/03/2023)   Received from [facility]    In the past 12 months, has lack of transportation kept you from medical appointments or from getting medications?: No    Lack of Transportation (Non-Medical): No  Physical Activity: Not on file  Stress: Not on file  Social Connections: Not on file  Intimate Partner Violence: Not At Risk (08/30/2022)   Humiliation, Afraid, Rape, and Kick questionnaire  Fear of Current or Ex-Partner: No    Emotionally Abused: No    Physically Abused: No    Sexually Abused: No    Review of Systems: See HPI, otherwise negative ROS  Physical Exam: BP (!) 146/78   Pulse 68   Temp (!) 97.5 F (36.4 C) (Temporal)   Resp 17   Ht 6\' 3"  (1.905 m)   Wt 126.1 kg   SpO2 100%   BMI 34.75 kg/m  General:   Alert, cooperative. Head:  Normocephalic and atraumatic. Respiratory:  Normal work of breathing. Cardiovascular:  NAD  Impression/Plan: Jeffery Meyer is here for cataract surgery.  Risks, benefits, limitations, and alternatives regarding cataract surgery have been reviewed with the patient.  Questions have been answered.  All parties agreeable.   Dusty Gin, MD  06/09/2023, 10:46 AM

## 2023-06-09 NOTE — Anesthesia Postprocedure Evaluation (Signed)
 Anesthesia Post Note  Patient: Jeffery Meyer  Procedure(s) Performed: PHACOEMULSIFICATION, CATARACT, WITH IOL INSERTION 3.17, 00:26.9 (Left)  Patient location during evaluation: PACU Anesthesia Type: MAC Level of consciousness: awake and alert Pain management: pain level controlled Vital Signs Assessment: post-procedure vital signs reviewed and stable Respiratory status: spontaneous breathing, nonlabored ventilation and respiratory function stable Cardiovascular status: stable and blood pressure returned to baseline Postop Assessment: no apparent nausea or vomiting Anesthetic complications: no   No notable events documented.   Last Vitals:  Vitals:   06/09/23 1115 06/09/23 1121  BP: 139/75 135/70  Pulse: 69 69  Resp: 16 14  Temp: 36.5 C 36.5 C  SpO2: 97% 98%    Last Pain:  Vitals:   06/09/23 1121  TempSrc:   PainSc: 0-No pain                 Baltazar Bonier

## 2023-06-09 NOTE — Transfer of Care (Signed)
 Immediate Anesthesia Transfer of Care Note  Patient: Jeffery Meyer  Procedure(s) Performed: PHACOEMULSIFICATION, CATARACT, WITH IOL INSERTION 3.17, 00:26.9 (Left)  Patient Location: PACU  Anesthesia Type: MAC  Level of Consciousness: awake, alert  and patient cooperative  Airway and Oxygen Therapy: Patient Spontanous Breathing and Patient connected to supplemental oxygen  Post-op Assessment: Post-op Vital signs reviewed, Patient's Cardiovascular Status Stable, Respiratory Function Stable, Patent Airway and No signs of Nausea or vomiting  Post-op Vital Signs: Reviewed and stable  Complications: No notable events documented.

## 2023-06-09 NOTE — Op Note (Signed)
 OPERATIVE NOTE  Jeffery Meyer [id number] 06/09/2023   PREOPERATIVE DIAGNOSIS:  Nuclear sclerotic cataract left eye.  H25.12   POSTOPERATIVE DIAGNOSIS:    Nuclear sclerotic cataract left eye.     PROCEDURE:  Phacoemusification with posterior chamber intraocular lens placement of the left eye   LENS:   Implant Name Type Inv. Item Serial No. Manufacturer Lot No. LRB No. Used Action  LENS IOL TECNIS EYHANCE 25.5 - [id number] Intraocular Lens LENS IOL TECNIS EYHANCE 25.5 [id number] [employer]  Left 1 Implanted      Procedure(s): PHACOEMULSIFICATION, CATARACT, WITH IOL INSERTION 3.17, 00:26.9 (Left)  SURGEON:  Dusty Gin, MD, MPH   ANESTHESIA:  Topical with tetracaine  drops augmented with 1% preservative-free intracameral lidocaine .  ESTIMATED BLOOD LOSS: <1 mL   COMPLICATIONS:  None.   DESCRIPTION OF PROCEDURE:  The patient was identified in the holding room and transported to the operating room and placed in the supine position under the operating microscope.  The left eye was identified as the operative eye and it was prepped and draped in the usual sterile ophthalmic fashion.   A 1.0 millimeter clear-corneal paracentesis was made at the 5:00 position. 0.5 ml of preservative-free 1% lidocaine  with epinephrine  was injected into the anterior chamber.  The anterior chamber was filled with viscoelastic.  A 2.4 millimeter keratome was used to make a near-clear corneal incision at the 2:00 position.  A curvilinear capsulorrhexis was made with a cystotome and capsulorrhexis forceps.  Balanced salt  solution was used to hydrodissect and hydrodelineate the nucleus.   Phacoemulsification was then used in stop and chop fashion to remove the lens nucleus and epinucleus.  The remaining cortex was then removed using the irrigation and aspiration handpiece. Viscoelastic was then placed into the capsular bag to distend it for lens placement.  A lens was then injected into the capsular bag.  The remaining  viscoelastic was aspirated.   Wounds were hydrated with balanced salt  solution.  The anterior chamber was inflated to a physiologic pressure with balanced salt  solution.  Intracameral vigamox  0.1 mL undiltued was injected into the eye and a drop placed onto the ocular surface.  No wound leaks were noted.  The patient was taken to the recovery room in stable condition without complications of anesthesia or surgery  Jeffery Meyer 06/09/2023, 11:13 AM
